# Patient Record
Sex: Female | Born: 1955 | Race: White | Hispanic: No | Marital: Married | Smoking: Never smoker
Health system: Southern US, Community
[De-identification: ages and names within clinical notes are randomized; demographics above are authoritative.]

## PROBLEM LIST (undated history)

## (undated) DIAGNOSIS — T7840XA Allergy, unspecified, initial encounter: Secondary | ICD-10-CM

## (undated) DIAGNOSIS — M1990 Unspecified osteoarthritis, unspecified site: Secondary | ICD-10-CM

## (undated) DIAGNOSIS — E785 Hyperlipidemia, unspecified: Secondary | ICD-10-CM

## (undated) DIAGNOSIS — R519 Headache, unspecified: Secondary | ICD-10-CM

## (undated) DIAGNOSIS — R51 Headache: Secondary | ICD-10-CM

## (undated) HISTORY — DX: Headache, unspecified: R51.9

## (undated) HISTORY — PX: TUBAL LIGATION: SHX77

## (undated) HISTORY — DX: Hyperlipidemia, unspecified: E78.5

## (undated) HISTORY — PX: TOOTH EXTRACTION: SUR596

## (undated) HISTORY — PX: BUNIONECTOMY: SHX129

## (undated) HISTORY — DX: Unspecified osteoarthritis, unspecified site: M19.90

## (undated) HISTORY — DX: Allergy, unspecified, initial encounter: T78.40XA

## (undated) HISTORY — DX: Headache: R51

## (undated) HISTORY — PX: ABLATION: SHX5711

---

## 1997-10-02 ENCOUNTER — Other Ambulatory Visit: Admission: RE | Admit: 1997-10-02 | Discharge: 1997-10-02 | Payer: Self-pay | Admitting: Obstetrics and Gynecology

## 1997-10-24 ENCOUNTER — Ambulatory Visit (HOSPITAL_COMMUNITY): Admission: RE | Admit: 1997-10-24 | Discharge: 1997-10-24 | Payer: Self-pay | Admitting: Obstetrics and Gynecology

## 1999-05-04 ENCOUNTER — Other Ambulatory Visit: Admission: RE | Admit: 1999-05-04 | Discharge: 1999-05-04 | Payer: Self-pay | Admitting: Obstetrics and Gynecology

## 1999-06-16 ENCOUNTER — Ambulatory Visit (HOSPITAL_COMMUNITY): Admission: RE | Admit: 1999-06-16 | Discharge: 1999-06-16 | Payer: Self-pay | Admitting: Obstetrics and Gynecology

## 1999-06-16 ENCOUNTER — Encounter (INDEPENDENT_AMBULATORY_CARE_PROVIDER_SITE_OTHER): Payer: Self-pay

## 2000-07-11 ENCOUNTER — Other Ambulatory Visit: Admission: RE | Admit: 2000-07-11 | Discharge: 2000-07-11 | Payer: Self-pay | Admitting: Obstetrics and Gynecology

## 2001-08-22 ENCOUNTER — Other Ambulatory Visit: Admission: RE | Admit: 2001-08-22 | Discharge: 2001-08-22 | Payer: Self-pay | Admitting: Obstetrics and Gynecology

## 2002-09-30 ENCOUNTER — Other Ambulatory Visit: Admission: RE | Admit: 2002-09-30 | Discharge: 2002-09-30 | Payer: Self-pay | Admitting: Obstetrics and Gynecology

## 2004-02-05 ENCOUNTER — Ambulatory Visit: Payer: Self-pay | Admitting: Family Medicine

## 2004-09-15 ENCOUNTER — Ambulatory Visit: Payer: Self-pay | Admitting: Family Medicine

## 2004-11-16 ENCOUNTER — Ambulatory Visit: Payer: Self-pay | Admitting: General Practice

## 2005-02-12 ENCOUNTER — Ambulatory Visit: Payer: Self-pay | Admitting: Family Medicine

## 2005-04-14 ENCOUNTER — Encounter (INDEPENDENT_AMBULATORY_CARE_PROVIDER_SITE_OTHER): Payer: Self-pay | Admitting: Internal Medicine

## 2005-04-20 ENCOUNTER — Ambulatory Visit: Payer: Self-pay | Admitting: Family Medicine

## 2005-04-20 ENCOUNTER — Other Ambulatory Visit: Admission: RE | Admit: 2005-04-20 | Discharge: 2005-04-20 | Payer: Self-pay | Admitting: Family Medicine

## 2005-06-20 ENCOUNTER — Ambulatory Visit: Payer: Self-pay | Admitting: Family Medicine

## 2005-06-20 ENCOUNTER — Encounter: Admission: RE | Admit: 2005-06-20 | Discharge: 2005-06-20 | Payer: Self-pay | Admitting: Family Medicine

## 2005-11-30 ENCOUNTER — Ambulatory Visit: Payer: Self-pay | Admitting: General Practice

## 2005-12-06 ENCOUNTER — Ambulatory Visit: Payer: Self-pay | Admitting: General Practice

## 2006-06-15 ENCOUNTER — Ambulatory Visit: Payer: Self-pay | Admitting: General Practice

## 2006-12-05 ENCOUNTER — Ambulatory Visit: Payer: Self-pay | Admitting: General Practice

## 2007-03-29 ENCOUNTER — Ambulatory Visit: Payer: Self-pay | Admitting: Family Medicine

## 2007-04-11 ENCOUNTER — Encounter (INDEPENDENT_AMBULATORY_CARE_PROVIDER_SITE_OTHER): Payer: Self-pay | Admitting: Internal Medicine

## 2007-06-18 ENCOUNTER — Ambulatory Visit: Payer: Self-pay | Admitting: Family Medicine

## 2007-06-22 ENCOUNTER — Encounter (INDEPENDENT_AMBULATORY_CARE_PROVIDER_SITE_OTHER): Payer: Self-pay | Admitting: Internal Medicine

## 2007-06-22 ENCOUNTER — Ambulatory Visit: Payer: Self-pay | Admitting: Family Medicine

## 2007-06-22 ENCOUNTER — Other Ambulatory Visit: Admission: RE | Admit: 2007-06-22 | Discharge: 2007-06-22 | Payer: Self-pay | Admitting: Family Medicine

## 2007-06-22 DIAGNOSIS — N6019 Diffuse cystic mastopathy of unspecified breast: Secondary | ICD-10-CM

## 2007-06-22 DIAGNOSIS — D259 Leiomyoma of uterus, unspecified: Secondary | ICD-10-CM

## 2007-06-22 LAB — CONVERTED CEMR LAB: Pap Smear: NORMAL

## 2007-07-02 ENCOUNTER — Encounter (INDEPENDENT_AMBULATORY_CARE_PROVIDER_SITE_OTHER): Payer: Self-pay | Admitting: *Deleted

## 2007-07-02 ENCOUNTER — Encounter (INDEPENDENT_AMBULATORY_CARE_PROVIDER_SITE_OTHER): Payer: Self-pay | Admitting: Internal Medicine

## 2007-08-21 ENCOUNTER — Telehealth (INDEPENDENT_AMBULATORY_CARE_PROVIDER_SITE_OTHER): Payer: Self-pay | Admitting: Internal Medicine

## 2007-09-07 ENCOUNTER — Ambulatory Visit: Payer: Self-pay | Admitting: Family Medicine

## 2007-09-10 ENCOUNTER — Encounter (INDEPENDENT_AMBULATORY_CARE_PROVIDER_SITE_OTHER): Payer: Self-pay | Admitting: *Deleted

## 2007-09-10 LAB — CONVERTED CEMR LAB: OCCULT 1: POSITIVE

## 2007-12-06 ENCOUNTER — Encounter (INDEPENDENT_AMBULATORY_CARE_PROVIDER_SITE_OTHER): Payer: Self-pay | Admitting: Internal Medicine

## 2007-12-12 ENCOUNTER — Ambulatory Visit: Payer: Self-pay | Admitting: General Practice

## 2007-12-19 ENCOUNTER — Ambulatory Visit: Payer: Self-pay | Admitting: Family Medicine

## 2007-12-19 DIAGNOSIS — E785 Hyperlipidemia, unspecified: Secondary | ICD-10-CM

## 2007-12-19 DIAGNOSIS — R7309 Other abnormal glucose: Secondary | ICD-10-CM

## 2008-02-01 ENCOUNTER — Encounter (INDEPENDENT_AMBULATORY_CARE_PROVIDER_SITE_OTHER): Payer: Self-pay | Admitting: Internal Medicine

## 2008-06-19 ENCOUNTER — Telehealth (INDEPENDENT_AMBULATORY_CARE_PROVIDER_SITE_OTHER): Payer: Self-pay | Admitting: Internal Medicine

## 2008-06-19 ENCOUNTER — Emergency Department (HOSPITAL_COMMUNITY): Admission: EM | Admit: 2008-06-19 | Discharge: 2008-06-19 | Payer: Self-pay | Admitting: Emergency Medicine

## 2008-06-23 ENCOUNTER — Telehealth (INDEPENDENT_AMBULATORY_CARE_PROVIDER_SITE_OTHER): Payer: Self-pay | Admitting: *Deleted

## 2008-06-24 ENCOUNTER — Encounter: Payer: Self-pay | Admitting: Cardiology

## 2008-06-24 ENCOUNTER — Ambulatory Visit: Payer: Self-pay

## 2008-06-24 DIAGNOSIS — R072 Precordial pain: Secondary | ICD-10-CM

## 2008-06-27 ENCOUNTER — Ambulatory Visit: Payer: Self-pay | Admitting: Cardiology

## 2008-06-27 ENCOUNTER — Ambulatory Visit: Payer: Self-pay | Admitting: Internal Medicine

## 2008-06-27 DIAGNOSIS — E782 Mixed hyperlipidemia: Secondary | ICD-10-CM

## 2008-06-27 DIAGNOSIS — F341 Dysthymic disorder: Secondary | ICD-10-CM | POA: Insufficient documentation

## 2008-07-29 ENCOUNTER — Ambulatory Visit: Payer: Self-pay | Admitting: Family Medicine

## 2008-09-04 ENCOUNTER — Encounter (INDEPENDENT_AMBULATORY_CARE_PROVIDER_SITE_OTHER): Payer: Self-pay | Admitting: Internal Medicine

## 2008-12-17 ENCOUNTER — Encounter (INDEPENDENT_AMBULATORY_CARE_PROVIDER_SITE_OTHER): Payer: Self-pay | Admitting: Internal Medicine

## 2008-12-17 ENCOUNTER — Ambulatory Visit: Payer: Self-pay | Admitting: General Practice

## 2008-12-18 ENCOUNTER — Encounter (INDEPENDENT_AMBULATORY_CARE_PROVIDER_SITE_OTHER): Payer: Self-pay | Admitting: Internal Medicine

## 2008-12-18 DIAGNOSIS — F410 Panic disorder [episodic paroxysmal anxiety] without agoraphobia: Secondary | ICD-10-CM

## 2008-12-18 DIAGNOSIS — M21619 Bunion of unspecified foot: Secondary | ICD-10-CM

## 2008-12-19 ENCOUNTER — Encounter (INDEPENDENT_AMBULATORY_CARE_PROVIDER_SITE_OTHER): Payer: Self-pay | Admitting: Internal Medicine

## 2008-12-19 ENCOUNTER — Ambulatory Visit: Payer: Self-pay | Admitting: Family Medicine

## 2008-12-19 ENCOUNTER — Other Ambulatory Visit: Admission: RE | Admit: 2008-12-19 | Discharge: 2008-12-19 | Payer: Self-pay | Admitting: Family Medicine

## 2008-12-19 DIAGNOSIS — E559 Vitamin D deficiency, unspecified: Secondary | ICD-10-CM

## 2008-12-19 LAB — CONVERTED CEMR LAB: Pap Smear: NORMAL

## 2008-12-25 ENCOUNTER — Encounter (INDEPENDENT_AMBULATORY_CARE_PROVIDER_SITE_OTHER): Payer: Self-pay | Admitting: Internal Medicine

## 2008-12-25 ENCOUNTER — Encounter: Payer: Self-pay | Admitting: Family Medicine

## 2008-12-31 ENCOUNTER — Telehealth (INDEPENDENT_AMBULATORY_CARE_PROVIDER_SITE_OTHER): Payer: Self-pay | Admitting: Internal Medicine

## 2009-01-01 ENCOUNTER — Encounter (INDEPENDENT_AMBULATORY_CARE_PROVIDER_SITE_OTHER): Payer: Self-pay | Admitting: Internal Medicine

## 2009-01-01 ENCOUNTER — Ambulatory Visit: Payer: Self-pay | Admitting: General Practice

## 2009-01-19 ENCOUNTER — Ambulatory Visit: Payer: Self-pay | Admitting: Family Medicine

## 2009-01-19 ENCOUNTER — Telehealth: Payer: Self-pay | Admitting: Family Medicine

## 2009-01-19 DIAGNOSIS — J40 Bronchitis, not specified as acute or chronic: Secondary | ICD-10-CM

## 2009-07-02 ENCOUNTER — Ambulatory Visit: Payer: Self-pay | Admitting: General Practice

## 2009-09-21 ENCOUNTER — Encounter (INDEPENDENT_AMBULATORY_CARE_PROVIDER_SITE_OTHER): Payer: Self-pay | Admitting: *Deleted

## 2009-12-09 ENCOUNTER — Ambulatory Visit: Payer: Self-pay | Admitting: General Practice

## 2010-03-18 NOTE — Letter (Signed)
Summary: Nadara Eaton letter  [facility]  [address] [place], [place] [address]   Phone: [phone]  Fax: [phone]       09/21/2009 MRN: [id number]  Maryama Granier [address] [place], [place]  [address]  Dear Ms. [facility] [place], and [place] announce the retirement of Arta Silence, M.D., from full-time practice at the [facility] office effective August 13, 2009 and his plans of returning part-time.  It is important to Dr. Hetty Ely and to our practice that you understand that [facility] - [facility] has seven physicians in our office for your health care needs.  We will continue to offer the same exceptional care that you have today.    Dr. Hetty Ely has spoken to many of you about his plans for retirement and returning part-time in the fall.   We will continue to work with you through the transition to schedule appointments for you in the office and meet the high standards that [place] is committed to.   Again, it is with great pleasure that we share the news that Dr. Hetty Ely will return to [facility] in October of 2011 with a reduced schedule.    If you have any questions, or would like to request an appointment with one of our physicians, please call us at [phone] and press the option for Scheduling an appointment.  We take pleasure in providing you with excellent patient care and look forward to seeing you at your next office visit.  Our [facility] Physicians are:  Tillman Abide, M.D. Laurita Quint, M.D. Roxy Manns, M.D. Kerby Nora, M.D. Hannah Beat, M.D. Ruthe Mannan, M.D. We proudly welcomed Raechel Ache, M.D. and Eustaquio Boyden, M.D. to the practice in July/August 2011.  Sincerely,   [facility]

## 2010-05-25 LAB — D-DIMER, QUANTITATIVE: D-Dimer, Quant: <0.22 ug/mL-FEU (ref 0.00–0.48)

## 2010-05-25 LAB — CBC
Hemoglobin: 13.9 g/dL (ref 12.0–15.0)
MCV: 84.1 fL (ref 78.0–100.0)
RBC: 4.7 MIL/uL (ref 3.87–5.11)

## 2010-05-25 LAB — POCT I-STAT, CHEM 8
BUN: 11 mg/dL (ref 6–23)
Chloride: 106 mEq/L (ref 96–112)
Creatinine, Ser: 0.8 mg/dL (ref 0.4–1.2)
Glucose, Bld: 111 mg/dL — ABNORMAL HIGH (ref 70–99)
Hemoglobin: 14.6 g/dL (ref 12.0–15.0)
Potassium: 3.4 mEq/L — ABNORMAL LOW (ref 3.5–5.1)
Sodium: 141 mEq/L (ref 135–145)
TCO2: 26 mmol/L (ref 0–100)

## 2010-05-25 LAB — DIFFERENTIAL
Basophils Absolute: 0 10*3/uL (ref 0.0–0.1)
Basophils Relative: 1 % (ref 0–1)
Eosinophils Relative: 2 % (ref 0–5)

## 2010-05-25 LAB — POCT CARDIAC MARKERS
CKMB, poc: 1 ng/mL (ref 1.0–8.0)
CKMB, poc: <1 ng/mL — ABNORMAL LOW (ref 1.0–8.0)
Myoglobin, poc: 44.8 ng/mL (ref 12–200)
Troponin i, poc: <0.05 ng/mL (ref 0.00–0.09)
Troponin i, poc: <0.05 ng/mL (ref 0.00–0.09)

## 2010-06-29 NOTE — Assessment & Plan Note (Signed)
[facility]                                 ON-CALL NOTE   NAME:Sparks, Valerie M                        MRN:          [id number]  DATE:06/20/2008                            DOB:          07-01-1955    [name] was in the emergency room at [facility] on the evening of  Thursday, Jun 19, 2008.  She was seen by Dr. Estell Harpin.  She had been under  a great deal of stress.  She came in with some chest discomfort and was  anxious.  She was assessed and there was no evidence of myocardial  ischemia.  Her EKG showed no change and her enzymes were normal.  Dr.  Estell Harpin contacted [place] about whether the patient could go home with early  outpatient followup.  In addition, the patient herself demanded to go  home at that point insisting that anxiety was the problem and that she  wanted an outpatient followup workup.   The patient is to be contacted with plans for early outpatient followup  in our office.  Name, [name].  Work phone number, [phone]  extension [phone].  Cell phone [phone] (the patient cannot be reached  on her cell phone during the work hours on Friday and she should be  called at work).   The patient does see Everrett Coombe, nurse practitioner, at our [facility].  We will make the arrangements for this early outpatient.  Followup with a stress study and an office followup.  This can be done  by me or anyone else in the group.     Luis Abed, MD, [facility]  Electronically Signed    JDK/MedQ  DD: 06/20/2008  DT: 06/20/2008  Job #: [id number]

## 2010-07-02 NOTE — Op Note (Signed)
[facility]  Patient:    Valerie Sparks, Valerie Sparks                    MRN: [id number] Proc. Date: 06/16/99 Adm. Date:  [id number] Attending:  Morene Antu                           Operative Report  PREOPERATIVE DIAGNOSIS:       Menorrhagia, fibroid uterus.  POSTOPERATIVE DIAGNOSIS:      Menorrhagia, fibroid uterus.  Submucosal fibroids.  OPERATION:                    D&C, hysteroscopy with resectoscope.  SURGEON:                      Sherry A. Rosalio Macadamia, M.D.  ASSISTANT:  ANESTHESIA:                   MAC.  ESTIMATED BLOOD LOSS:  INDICATIONS:                  This is a 55 year old, gravida 1, para 1-0-0-1, woman who has a menstrual period every month lasting four days.  The patient has had spotting for one to two weeks prior to her periods.  The patient complains of excessively heavy flow, changing a Super Plus tampon every 30 to 60 minutes and a pad.  She passes large clots and complains of dysmenorrhea.  Ultrasound was performed in the office which showed multiple fibroids including some small hyperechoic masses in the endometrial cavity.  Because of this, the patient is brought to the operating room for [facility], hysteroscopy with resectoscope.  FINDINGS:                     Eight weeks size anteflexed uterus.  No adnexal mass. Enlarged endometrial cavity with probable small submucosal fibroids.  DESCRIPTION OF PROCEDURE:     The patient is brought into the operating room and given adequate IV sedation.  She was placed in the dorsal lithotomy position. er perineum was washed with Betadine.  Pelvic examination was performed.  The bladder was in-and-out catheterized.  The surgeons gown and gloves were changed.  The patient was draped in a sterile fashion.  The speculum was placed within the vagina.  The vagina was washed with Betadine.  Paracervical block was administered with 1% Nesocaine.  Anterior lip of the cervix was grasped with a  single tooth tenaculum.  The cervix was sounded.  The cervix was dilated with Pratt dilators to a #31.  The hysteroscope was easily introduced into the endometrial cavity. Pictures were obtained.  Using a double loupe right angle resector, the polypoid tissue that was felt to be submucosal fibroids was excised circumferentially. Samples were taken throughout the uterus.  Bleeders were cauterized.  Once adequate hemostasis was present and samples had been taken throughout, all instruments were removed from the vagina.  The patient was taken out of the dorsal lithotomy position.  She was awakened and she was removed from the operating table to a stretcher in stable condition.  Complications were none.  Estimated blood loss as less than 5 cc.  Sorbitol differential 30 cc. DD:  06/16/99 TD:  06/17/99 Job: [id number]

## 2010-12-08 ENCOUNTER — Ambulatory Visit: Payer: Self-pay

## 2011-12-07 ENCOUNTER — Ambulatory Visit: Payer: Self-pay

## 2012-12-12 ENCOUNTER — Ambulatory Visit: Payer: Self-pay | Admitting: General Practice

## 2013-03-14 LAB — CBC AND DIFFERENTIAL
HCT: 42 % (ref 36–46)
HEMOGLOBIN: 13.7 g/dL (ref 12.0–16.0)
PLATELETS: 325 10*3/uL (ref 150–399)
WBC: 5.4 10^3/mL

## 2013-03-14 LAB — LIPID PANEL
CHOLESTEROL: 183 mg/dL (ref 0–200)
HDL: 41 mg/dL (ref 35–70)
LDL CALC: 116 mg/dL
TRIGLYCERIDES: 128 mg/dL (ref 40–160)

## 2013-03-14 LAB — HEPATIC FUNCTION PANEL
ALT: 28 U/L (ref 7–35)
AST: 27 U/L (ref 13–35)

## 2013-03-14 LAB — HEMOGLOBIN A1C: HEMOGLOBIN A1C: 6.1 % — AB (ref 4.0–6.0)

## 2013-03-14 LAB — TSH: TSH: 1.6 u[IU]/mL (ref 0.41–5.90)

## 2013-03-14 LAB — BASIC METABOLIC PANEL: Glucose: 102 mg/dL

## 2013-03-25 ENCOUNTER — Encounter: Payer: Self-pay | Admitting: Family Medicine

## 2013-03-25 ENCOUNTER — Encounter: Payer: Self-pay | Admitting: Internal Medicine

## 2013-03-25 ENCOUNTER — Ambulatory Visit (INDEPENDENT_AMBULATORY_CARE_PROVIDER_SITE_OTHER): Payer: PRIVATE HEALTH INSURANCE | Admitting: Family Medicine

## 2013-03-25 VITALS — BP 126/70 | HR 74 | Temp 98.1°F | Ht 62.0 in | Wt 173.5 lb

## 2013-03-25 DIAGNOSIS — R7309 Other abnormal glucose: Secondary | ICD-10-CM

## 2013-03-25 DIAGNOSIS — Z1211 Encounter for screening for malignant neoplasm of colon: Secondary | ICD-10-CM

## 2013-03-25 DIAGNOSIS — E559 Vitamin D deficiency, unspecified: Secondary | ICD-10-CM

## 2013-03-25 DIAGNOSIS — Z0000 Encounter for general adult medical examination without abnormal findings: Secondary | ICD-10-CM

## 2013-03-25 DIAGNOSIS — F341 Dysthymic disorder: Secondary | ICD-10-CM

## 2013-03-25 DIAGNOSIS — E782 Mixed hyperlipidemia: Secondary | ICD-10-CM

## 2013-03-25 NOTE — Patient Instructions (Addendum)
Great to see you. Please stop by to see Rosaria Ferries on your way out to set up your referral.  Keep exercising!

## 2013-03-25 NOTE — Assessment & Plan Note (Signed)
Stable off rx

## 2013-03-25 NOTE — Progress Notes (Signed)
Pre-visit discussion using our clinic review tool. No additional management support is needed unless otherwise documented below in the visit note.  

## 2013-03-25 NOTE — Assessment & Plan Note (Signed)
Stable on current rx. No changes. 

## 2013-03-25 NOTE — Assessment & Plan Note (Signed)
Reviewed preventive care protocols, scheduled due services, and updated immunizations Discussed nutrition, exercise, diet, and healthy lifestyle.  Refer to GI for screening colonoscopy. Orders Placed This Encounter  Procedures  . CBC and differential  . Basic metabolic panel  . Lipid panel  . Hepatic function panel  . Hemoglobin A1c  . TSH  . Ambulatory referral to Gastroenterology

## 2013-03-25 NOTE — Progress Notes (Signed)
Subjective:   Patient ID: Valerie Sparks, female    DOB: 1955/09/01, 58 y.o.   MRN: [id number]  Valerie Sparks is a pleasant 58 y.o. year old female with h/o anxiety/depression, HLD, hyperglycemia, vit D deficiency, who presents to clinic today with Establish Care and Annual Exam  on 03/25/2013  HPI: HLD- on Simvastatin 20 mg daily.  Denies any myalgias. Lab Results  Component Value Date   CHOL 183 03/14/2013   HDL 41 03/14/2013   LDLCALC 116 03/14/2013   TRIG 128 03/14/2013   Has GYN- no h/o abnormal pap smears. S/p ablation.  No h/o postmenopausal bleeding. Last pap smear in 06/2012. Mammogram in 11/2012.  Never had a colonoscopy. No FH of colon CA.  Denies any changes in her bowel habits or blood in her stool.  H/o hyperglycemia- Lab Results  Component Value Date   HGBA1C 6.1* 03/14/2013   Has been working on diet.  Just joined a gym with her husband.  Anxiety- was previously ativan.  Feels she no longer needs anything.  Denies any symptoms of anxiety or depression. Patient Active Problem List   Diagnosis Date Noted  . Routine general medical examination at a health care facility 03/25/2013  . VITAMIN D DEFICIENCY 12/19/2008  . PANIC ATTACK 12/18/2008  . BUNION, RIGHT FOOT 12/18/2008  . Mixed hyperlipidemia 06/27/2008  . ANXIETY DEPRESSION 06/27/2008  . HYPERLIPIDEMIA 12/19/2007  . HYPERGLYCEMIA 12/19/2007  . FIBROIDS, UTERUS 06/22/2007  . BREAST CYSTS, LEFT 06/22/2007   Past Medical History  Diagnosis Date  . Frequent headaches   . Allergy   . Hyperlipidemia    Past Surgical History  Procedure Laterality Date  . Bunionectomy    . Tubal ligation    . Ablation    . Tooth extraction     History  Substance Use Topics  . Smoking status: Never Smoker   . Smokeless tobacco: Never Used  . Alcohol Use: Yes   Family History  Problem Relation Age of Onset  . Hypertension Mother   . Diabetes Mother   . Heart disease Father   . Diabetes Sister   . Diabetes  Brother   . Cancer Paternal Aunt   . Cancer Maternal Grandmother   . Kidney disease Paternal Grandfather    Allergies  Allergen Reactions  . Pseudoephedrine Other (See Comments)    "takes me out of my skin"   No current outpatient prescriptions on file prior to visit.   No current facility-administered medications on file prior to visit.   The PMH, PSH, Social History, Family History, Medications, and allergies have been reviewed in [facility], and have been updated if relevant.   Review of Systems See HPI Patient reports no  vision/ hearing changes,anorexia, weight change, fever ,adenopathy, persistant / recurrent hoarseness, swallowing issues, chest pain, edema,persistant / recurrent cough, hemoptysis, dyspnea(rest, exertional, paroxysmal nocturnal), gastrointestinal  bleeding (melena, rectal bleeding), abdominal pain, excessive heart burn, GU symptoms(dysuria, hematuria, pyuria, voiding/incontinence  Issues) syncope, focal weakness, severe memory loss, concerning skin lesions, depression, anxiety, abnormal bruising/bleeding, major joint swelling, breast masses or abnormal vaginal bleeding.       Objective:    BP 126/70  Pulse 74  Temp(Src) 98.1 F (36.7 C) (Oral)  Ht 5\' 2"  (1.575 m)  Wt 173 lb 8 oz (78.699 kg)  BMI 31.73 kg/m2  SpO2 97%   Physical Exam   General:  Well-developed,well-nourished,in no acute distress; alert,appropriate and cooperative throughout examination Head:  normocephalic and atraumatic.   Eyes:  vision  grossly intact, pupils equal, pupils round, and pupils reactive to light.   Ears:  R ear normal and L ear normal.   Nose:  no external deformity.   Lungs:  Normal respiratory effort, chest expands symmetrically. Lungs are clear to auscultation, no crackles or wheezes. Heart:  Normal rate and regular rhythm. S1 and S2 normal without gallop, murmur, click, rub or other extra sounds. Abdomen:  Bowel sounds positive,abdomen soft and non-tender without masses,  organomegaly or hernias noted. Msk:  No deformity or scoliosis noted of thoracic or lumbar spine.   Extremities:  No clubbing, cyanosis, edema, or deformity noted with normal full range of motion of all joints.   Neurologic:  alert & oriented X3 and gait normal.   Skin:  Intact without suspicious lesions or rashes Cervical Nodes:  No lymphadenopathy noted Axillary Nodes:  No palpable lymphadenopathy Psych:  Cognition and judgment appear intact. Alert and cooperative with normal attention span and concentration. No apparent delusions, illusions, hallucinations       Assessment & Plan:   VITAMIN D DEFICIENCY  Mixed hyperlipidemia  HYPERGLYCEMIA  Routine general medical examination at a health care facility  ANXIETY DEPRESSION No Follow-up on file.

## 2013-04-05 ENCOUNTER — Ambulatory Visit (AMBULATORY_SURGERY_CENTER): Payer: PRIVATE HEALTH INSURANCE | Admitting: *Deleted

## 2013-04-05 VITALS — Ht 62.0 in | Wt 169.0 lb

## 2013-04-05 DIAGNOSIS — Z1211 Encounter for screening for malignant neoplasm of colon: Secondary | ICD-10-CM

## 2013-04-05 MED ORDER — NA SULFATE-K SULFATE-MG SULF 17.5-3.13-1.6 GM/177ML PO SOLN: ORAL | Status: DC

## 2013-04-05 NOTE — Progress Notes (Signed)
No egg or soy allergy 

## 2013-04-26 ENCOUNTER — Encounter: Payer: Self-pay | Admitting: Internal Medicine

## 2013-04-26 ENCOUNTER — Ambulatory Visit (AMBULATORY_SURGERY_CENTER): Payer: PRIVATE HEALTH INSURANCE | Admitting: Internal Medicine

## 2013-04-26 VITALS — BP 126/72 | HR 67 | Temp 98.0°F | Resp 19 | Ht 62.0 in | Wt 169.0 lb

## 2013-04-26 DIAGNOSIS — Z1211 Encounter for screening for malignant neoplasm of colon: Secondary | ICD-10-CM

## 2013-04-26 HISTORY — PX: COLONOSCOPY: SHX174

## 2013-04-26 MED ORDER — SODIUM CHLORIDE 0.9 % IV SOLN: 500.0000 mL | INTRAVENOUS | Status: DC

## 2013-04-26 NOTE — Progress Notes (Signed)
  [facility] Post-op Note  Patient: Valerie Sparks  Procedure(s) Performed: colonoscopy  Patient Location: [facility] - Recovery Area  Anesthesia Type: Deep Sedation/Propofol  Level of Consciousness: awake, oriented and patient cooperative  Airway and Oxygen Therapy: Patient Spontanous Breathing  Post-op Pain: none  Post-op Assessment:  Post-op Vital signs reviewed, Patient's Cardiovascular Status Stable, Respiratory Function Stable, Patent Airway, No signs of Nausea or vomiting and Pain level controlled  Post-op Vital Signs: Reviewed and stable  Complications: No apparent anesthesia complications  Craven Crean E 10:13 AM

## 2013-04-26 NOTE — Op Note (Signed)
[place]  [address] [place], [address]   COLONOSCOPY PROCEDURE REPORT  PATIENT: Valerie Sparks, Valerie Sparks  MR#: [id number] BIRTHDATE: March 09, 1955 , 58  yrs. old GENDER: Female ENDOSCOPIST: Gatha Mayer, MD, [facility] REFERRED YM:EBRAX Aron, M.D. PROCEDURE DATE:  04/26/2013 PROCEDURE:   Colonoscopy, screening First Screening Colonoscopy - Avg.  risk and is 50 yrs.  old or older Yes.  Prior Negative Screening - Now for repeat screening. N/A  History of Adenoma - Now for follow-up colonoscopy & has been > or = to 3 yrs.  N/A  Polyps Removed Today? No.  Recommend repeat exam, <10 yrs? No. ASA CLASS:   Class II INDICATIONS:average risk screening and first colonoscopy. MEDICATIONS: propofol (Diprivan) 200mg  IV, MAC sedation, administered by CRNA, and These medications were titrated to patient response per physician's verbal order  DESCRIPTION OF PROCEDURE:   After the risks benefits and alternatives of the procedure were thoroughly explained, informed consent was obtained.  A digital rectal exam revealed no rectal mass and A digital rectal exam revealed several skin tags.   The LB [id number]  endoscope was introduced through the anus and advanced to the terminal ileum which was intubated for a short distance. No adverse events experienced.   The quality of the prep was excellent using Suprep  The instrument was then slowly withdrawn as the colon was fully examined.  COLON FINDINGS: A normal appearing cecum, ileocecal valve, and appendiceal orifice were identified.  The ascending, hepatic flexure, transverse, splenic flexure, descending, sigmoid colon and rectum appeared unremarkable.  No polyps or cancers were seen.   A right colon retroflexion was performed.  Retroflexed views revealed anal tags. The time to cecum=3 minutes 28 seconds.  Withdrawal time=8 minutes 08 seconds.  The scope was withdrawn and the procedure completed. COMPLICATIONS: There were no  complications.  ENDOSCOPIC IMPRESSION: Normal colon  - excellent prep - first screening Anal skin tags  RECOMMENDATIONS: Repeat Colonscopy in 10 years 2025  eSigned:  Gatha Mayer, MD, [facility] 04/26/2013 10:13 AM   cc: Arnette Norris MD and The Patient

## 2013-04-26 NOTE — Patient Instructions (Addendum)
No polyps or cancer seen!  Next routine colonoscopy in 10 years - 2025  I appreciate the opportunity to care for you. Gatha Mayer, MD, FACG  YOU HAD AN ENDOSCOPIC PROCEDURE TODAY AT [facility]: Refer to the procedure report that was given to you for any specific questions about what was found during the examination.  If the procedure report does not answer your questions, please call your gastroenterologist to clarify.  If you requested that your care partner not be given the details of your procedure findings, then the procedure report has been included in a sealed envelope for you to review at your convenience later.  YOU SHOULD EXPECT: Some feelings of bloating in the abdomen. Passage of more gas than usual.  Walking can help get rid of the air that was put into your GI tract during the procedure and reduce the bloating. If you had a lower endoscopy (such as a colonoscopy or flexible sigmoidoscopy) you may notice spotting of blood in your stool or on the toilet paper. If you underwent a bowel prep for your procedure, then you may not have a normal bowel movement for a few days.  DIET: Your first meal following the procedure should be a light meal and then it is ok to progress to your normal diet.  A half-sandwich or bowl of soup is an example of a good first meal.  Heavy or fried foods are harder to digest and may make you feel nauseous or bloated.  Likewise meals heavy in dairy and vegetables can cause extra gas to form and this can also increase the bloating.  Drink plenty of fluids but you should avoid alcoholic beverages for 24 hours.  ACTIVITY: Your care partner should take you home directly after the procedure.  You should plan to take it easy, moving slowly for the rest of the day.  You can resume normal activity the day after the procedure however you should NOT DRIVE or use heavy machinery for 24 hours (because of the sedation medicines used during the test).     SYMPTOMS TO REPORT IMMEDIATELY: A gastroenterologist can be reached at any hour.  During normal business hours, 8:30 AM to 5:00 PM Monday through Friday, call [phone].  After hours and on weekends, please call the GI answering service at [phone] who will take a message and have the physician on call contact you.   Following lower endoscopy (colonoscopy or flexible sigmoidoscopy):  Excessive amounts of blood in the stool  Significant tenderness or worsening of abdominal pains  Swelling of the abdomen that is new, acute  Fever of 100F or higher  FOLLOW UP: If any biopsies were taken you will be contacted by phone or by letter within the next 1-3 weeks.  Call your gastroenterologist if you have not heard about the biopsies in 3 weeks.  Our staff will call the home number listed on your records the next business day following your procedure to check on you and address any questions or concerns that you may have at that time regarding the information given to you following your procedure. This is a courtesy call and so if there is no answer at the home number and we have not heard from you through the emergency physician on call, we will assume that you have returned to your regular daily activities without incident.  SIGNATURES/CONFIDENTIALITY: You and/or your care partner have signed paperwork which will be entered into your electronic medical record.  These  signatures attest to the fact that that the information above on your After Visit Summary has been reviewed and is understood.  Full responsibility of the confidentiality of this discharge information lies with you and/or your care-partner. 

## 2013-04-29 ENCOUNTER — Telehealth: Payer: Self-pay | Admitting: *Deleted

## 2013-04-29 NOTE — Telephone Encounter (Signed)
  Follow up Call-  Call back number 04/26/2013  Post procedure Call Back phone  # [phone]  Permission to leave phone message Yes     Patient questions:  Do you have a fever, pain , or abdominal swelling? no Pain Score  0 *  Have you tolerated food without any problems? yes  Have you been able to return to your normal activities? yes  Do you have any questions about your discharge instructions: Diet   no Medications  no Follow up visit  no  Do you have questions or concerns about your Care? no  Actions: * If pain score is 4 or above: No action needed, pain <4.

## 2013-12-10 ENCOUNTER — Ambulatory Visit: Payer: Self-pay | Admitting: General Practice

## 2014-06-09 ENCOUNTER — Encounter: Payer: Self-pay | Admitting: Podiatry

## 2014-06-09 ENCOUNTER — Ambulatory Visit (INDEPENDENT_AMBULATORY_CARE_PROVIDER_SITE_OTHER): Payer: PRIVATE HEALTH INSURANCE

## 2014-06-09 ENCOUNTER — Ambulatory Visit (INDEPENDENT_AMBULATORY_CARE_PROVIDER_SITE_OTHER): Payer: PRIVATE HEALTH INSURANCE | Admitting: Podiatry

## 2014-06-09 DIAGNOSIS — M79671 Pain in right foot: Secondary | ICD-10-CM

## 2014-06-09 DIAGNOSIS — M722 Plantar fascial fibromatosis: Secondary | ICD-10-CM | POA: Diagnosis not present

## 2014-06-09 MED ORDER — TRIAMCINOLONE ACETONIDE 10 MG/ML IJ SUSP
10.0000 mg | Freq: Once | INTRAMUSCULAR | Status: AC
  Administered 2014-06-09: 10 mg

## 2014-06-09 NOTE — Progress Notes (Signed)
   Subjective:    Patient ID: Valerie Sparks, female    DOB: 08-Apr-1955, 59 y.o.   MRN: [id number]  HPI  I have a lump in the arch of my right foot it aches, burns and sharp shooting pains. When it swells it can get very red. It is not too swollen today but very tender     Review of Systems  Musculoskeletal: Positive for back pain and joint swelling.       Objective:   Physical Exam        Assessment & Plan:

## 2014-06-10 NOTE — Progress Notes (Signed)
Subjective:     Patient ID: Valerie Sparks, female   DOB: 1955/08/07, 59 y.o.   MRN: [id number]  HPI patient presents stating I have this lump in my right arch that becomes painful and makes it hard for me to walk. It's only been there for a little while and I have not noted whether or not it was there prior to starting to hurt 3 weeks ago   Review of Systems  All other systems reviewed and are negative.      Objective:   Physical Exam  Constitutional: She is oriented to person, place, and time.  Cardiovascular: Intact distal pulses.   Musculoskeletal: Normal range of motion.  Neurological: She is oriented to person, place, and time.  Skin: Skin is warm.  Nursing note and vitals reviewed.  her vascular status intact with muscle strength adequate and range of motion subtalar midtarsal joint within normal limits. Patient is noted to have a mass within the right arch measuring approximately 8 mm x 8 mm that's painful when pressed and relatively new in its origin. Patient is found to have good digital perfusion and is well oriented 3     Assessment:     Possible plantar fibroma versus small cyst in the plantar aspect of the right arch    Plan:     H&P and condition reviewed with patient. We're going to do a careful injection to try to shrink it and reduce pain and if it were to reoccur or become larger or painful or change color than it we'll need to be removed. Patient understands this and risk and wants procedure and I carefully injected with 3 mg Kenalog 5 mg Xylocaine and advised on heat and cold treatment area

## 2014-06-30 ENCOUNTER — Ambulatory Visit (INDEPENDENT_AMBULATORY_CARE_PROVIDER_SITE_OTHER): Payer: PRIVATE HEALTH INSURANCE | Admitting: Podiatry

## 2014-06-30 DIAGNOSIS — M722 Plantar fascial fibromatosis: Secondary | ICD-10-CM | POA: Diagnosis not present

## 2014-07-01 NOTE — Progress Notes (Signed)
Subjective:     Patient ID: Valerie Sparks, female   DOB: 08/04/1955, 59 y.o.   MRN: [id number]  HPI patient presents stating the swelling has reduced but I still have discomfort in my right arch   Review of Systems     Objective:   Physical Exam Neurovascular status intact with muscle strength within normal limits and noted to have continued discomfort in the mid arch area right but no palpable nodules at the current time    Assessment:     Appears to be more inflammatory in nature along the fascial band with fibromas or cyst which have reduced in size currently    Plan:     I want to try to continue to treat this conservatively and I did dispense a night splint to try to stretch the tendon and advised on heat stretch and ice of the area. Patient will be seen back to recheck again in the next 4 weeks and may require different types treatment depending on response

## 2014-07-28 ENCOUNTER — Ambulatory Visit (INDEPENDENT_AMBULATORY_CARE_PROVIDER_SITE_OTHER): Payer: PRIVATE HEALTH INSURANCE | Admitting: Podiatry

## 2014-07-28 ENCOUNTER — Encounter: Payer: Self-pay | Admitting: Podiatry

## 2014-07-28 VITALS — BP 170/74 | HR 68 | Resp 13

## 2014-07-28 DIAGNOSIS — M722 Plantar fascial fibromatosis: Secondary | ICD-10-CM | POA: Diagnosis not present

## 2014-07-28 MED ORDER — TRIAMCINOLONE ACETONIDE 10 MG/ML IJ SUSP
10.0000 mg | Freq: Once | INTRAMUSCULAR | Status: AC
  Administered 2014-07-28: 10 mg

## 2014-07-29 NOTE — Progress Notes (Signed)
Subjective:     Patient ID: Valerie Sparks, female   DOB: Oct 09, 1955, 59 y.o.   MRN: [id number]  HPI patient states the arch is quite a bit better but it is still sore and I feel like possibly the inflammation is returning   Review of Systems     Objective:   Physical Exam Neurovascular status intact with some reoccurrence of inflammatory changes with small nodular formation in the mid arch region right with pain upon palpation    Assessment:     Plantar fasciitis with possibility for plantar nodule right mid arch area    Plan:     I advised on physical therapy consisting of heat stretches ice and night splint usage and I did go ahead and I reinjected the plantar fascia mid arch 3 mg Kenalog 5 mg Xylocaine Marcaine. I gave instructions and should recur or become painful work and the need to consider surgical excision

## 2014-09-24 ENCOUNTER — Ambulatory Visit (INDEPENDENT_AMBULATORY_CARE_PROVIDER_SITE_OTHER): Payer: PRIVATE HEALTH INSURANCE | Admitting: Podiatry

## 2014-09-24 ENCOUNTER — Encounter: Payer: Self-pay | Admitting: Podiatry

## 2014-09-24 VITALS — BP 109/67 | HR 76 | Resp 16

## 2014-09-24 DIAGNOSIS — M722 Plantar fascial fibromatosis: Secondary | ICD-10-CM

## 2014-09-24 NOTE — Patient Instructions (Signed)
Pre-Operative Instructions  Congratulations, you have decided to take an important step to improving your quality of life.  You can be assured that the doctors of [facility] will be with you every step of the way.  1. Plan to be at the surgery center/hospital at least 1 (one) hour prior to your scheduled time unless otherwise directed by the surgical center/hospital staff.  You must have a responsible adult accompany you, remain during the surgery and drive you home.  Make sure you have directions to the surgical center/hospital and know how to get there on time. 2. For hospital based surgery you will need to obtain a history and physical form from your family physician within 1 month prior to the date of surgery- we will give you a form for you primary physician.  3. We make every effort to accommodate the date you request for surgery.  There are however, times where surgery dates or times have to be moved.  We will contact you as soon as possible if a change in schedule is required.   4. No Aspirin/Ibuprofen for one week before surgery.  If you are on aspirin, any non-steroidal anti-inflammatory medications (Mobic, Aleve, Ibuprofen) you should stop taking it 7 days prior to your surgery.  You make take Tylenol  For pain prior to surgery.  5. Medications- If you are taking daily heart and blood pressure medications, seizure, reflux, allergy, asthma, anxiety, pain or diabetes medications, make sure the surgery center/hospital is aware before the day of surgery so they may notify you which medications to take or avoid the day of surgery. 6. No food or drink after midnight the night before surgery unless directed otherwise by surgical center/hospital staff. 7. No alcoholic beverages 24 hours prior to surgery.  No smoking 24 hours prior to or 24 hours after surgery. 8. Wear loose pants or shorts- loose enough to fit over bandages, boots, and casts. 9. No slip on shoes, sneakers are best. 10. Bring  your boot with you to the surgery center/hospital.  Also bring crutches or a walker if your physician has prescribed it for you.  If you do not have this equipment, it will be provided for you after surgery. 11. If you have not been contracted by the surgery center/hospital by the day before your surgery, call to confirm the date and time of your surgery. 12. Leave-time from work may vary depending on the type of surgery you have.  Appropriate arrangements should be made prior to surgery with your employer. 13. Prescriptions will be provided immediately following surgery by your doctor.  Have these filled as soon as possible after surgery and take the medication as directed. 14. Remove nail polish on the operative foot. 15. Wash the night before surgery.  The night before surgery wash the foot and leg well with the antibacterial soap provided and water paying special attention to beneath the toenails and in between the toes.  Rinse thoroughly with water and dry well with a towel.  Perform this wash unless told not to do so by your physician.  Enclosed: 1 Ice pack (please put in freezer the night before surgery)   1 Hibiclens skin cleaner   Pre-op Instructions  If you have any questions regarding the instructions, do not hesitate to call our office.  [place]: [address] [place], [place] [address] [phone]  [place]: [address] [place], [place] [address] [phone]  [place]: [address]  [place], [place] [address] [phone]  Dr. Richard   Tuchman DPM, Dr. Norman Regal DPM Dr. Richard Sikora DPM, Dr. M. Todd Hyatt DPM, Dr. Kathryn Egerton DPM 

## 2014-09-24 NOTE — Progress Notes (Signed)
Subjective:     Patient ID: Valerie Sparks, female   DOB: 10/13/1955, 59 y.o.   MRN: [id number]  HPI patient presents stating these nodules are killing me on my right foot and something has to be done as I cannot walk comfortably. States that they have grown in size and there seems to be a second one   Review of Systems     Objective:   Physical Exam Neurovascular status intact muscle strength was adequate with a nodule measuring approximately 1 x 1 cm plantar aspect right arch and a smaller distal one measuring 5 x 5 mm that is painful also. 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 I've recommended excision at this time due to long-standing nature growth in size pain and failure to respond to conservative care. Patient wants to have this done and at this time I allowed her to read a consent form reviewing alternative treatments complications associated with this procedure and the fact that recovery can take 6 months to one year and I do want her nonweightbearing for 2-4 weeks. Patient wants surgery and signs consent form and is dispensed air fracture walker at the current time for usage after surgery

## 2014-09-25 ENCOUNTER — Telehealth: Payer: Self-pay | Admitting: *Deleted

## 2014-09-25 NOTE — Telephone Encounter (Signed)
"  I want to reschedule my surgery.  I went back to work and that pain hit me.  I said I got to go ahead and take care of this."  When are you scheduled?  "I'm scheduled for 10/28/2014."  The only other time he has is 10/21/2014.  "Okay, I'm already off that Monday for Labor Day, that will be fine."  I'll call the surgical center and reschedule it.  "Thank you, hope I'm not putting you through too much trouble."  You are not at all.  I called surgical center and rescheduled the surgery from 10/28/2014 to 10/21/2014.

## 2014-10-02 LAB — LIPID PANEL
Cholesterol: 164 mg/dL (ref 0–200)
HDL: 45 mg/dL (ref 35–70)
LDL CALC: 94 mg/dL
TRIGLYCERIDES: 123 mg/dL (ref 40–160)

## 2014-10-02 LAB — HEMOGLOBIN A1C: Hemoglobin A1C: 6.1

## 2014-10-02 LAB — TSH: TSH: 1.67 u[IU]/mL (ref 0.41–5.90)

## 2014-10-15 ENCOUNTER — Telehealth: Payer: Self-pay | Admitting: *Deleted

## 2014-10-15 NOTE — Telephone Encounter (Signed)
I called to check benefits for patient's surgery scheduled for 10/21/2014.  Valerie Sparks informed me patient has a $1500 deductible.  $460 met thus far.  80% / 20% benefits, no pre-cert needed.  Reference number is [id number].

## 2014-10-21 ENCOUNTER — Encounter: Payer: Self-pay | Admitting: *Deleted

## 2014-10-21 DIAGNOSIS — M722 Plantar fascial fibromatosis: Secondary | ICD-10-CM

## 2014-10-21 DIAGNOSIS — D492 Neoplasm of unspecified behavior of bone, soft tissue, and skin: Secondary | ICD-10-CM | POA: Diagnosis not present

## 2014-10-22 ENCOUNTER — Telehealth: Payer: Self-pay | Admitting: *Deleted

## 2014-10-22 NOTE — Progress Notes (Signed)
Surgery performed at [facility], Plantar Fibroma right foot.  Prescriptions were written for Demerol 50 mg, take one or two by mouth every 4-6 hours as needed for pain, quantity 35, 0 refills. Phenergan 25 mg, one or two by mouth every 4-6 hours with Demerol, quantity 35, 0 refills.

## 2014-10-22 NOTE — Telephone Encounter (Signed)
"  I'm doing okay.  I haven't had any pain.  I been taking my pain medication every 4.5 hours.  I been keeping it elevated like he told me to."   Wonderful, glad to hear.  How was everything at the surgical center?  "It was awesome!  Everyone was great."  We'll see you on 10/27/2014 at 2:45pm.

## 2014-10-27 ENCOUNTER — Other Ambulatory Visit: Payer: Self-pay

## 2014-10-30 ENCOUNTER — Encounter: Payer: Self-pay | Admitting: Podiatry

## 2014-10-30 ENCOUNTER — Ambulatory Visit (INDEPENDENT_AMBULATORY_CARE_PROVIDER_SITE_OTHER): Payer: PRIVATE HEALTH INSURANCE | Admitting: Podiatry

## 2014-10-30 VITALS — BP 144/85 | HR 87 | Resp 16

## 2014-10-30 DIAGNOSIS — M722 Plantar fascial fibromatosis: Secondary | ICD-10-CM | POA: Diagnosis not present

## 2014-10-31 NOTE — Progress Notes (Signed)
Subjective:     Patient ID: Valerie Sparks, female   DOB: Mar 24, 1955, 59 y.o.   MRN: [id number]  HPI patient presents the bottom of my right arch is doing very well   Review of Systems     Objective:   Physical Exam Neurovascular status intact negative Homans sign noted with wound edges well coapted and stitches intact with minimal edema noted and no drainage    Assessment:     Doing well post plantar fibroma excision right    Plan:     Reapplied sterile dressing advised on continued boot usage and nonweightbearing. Reappoint in 2 weeks for stitch removal or earlier if any issues should occur

## 2014-11-14 ENCOUNTER — Ambulatory Visit (INDEPENDENT_AMBULATORY_CARE_PROVIDER_SITE_OTHER): Payer: PRIVATE HEALTH INSURANCE | Admitting: Podiatry

## 2014-11-14 VITALS — BP 132/78 | HR 74 | Resp 16

## 2014-11-14 DIAGNOSIS — M722 Plantar fascial fibromatosis: Secondary | ICD-10-CM

## 2014-11-14 DIAGNOSIS — Z9889 Other specified postprocedural states: Secondary | ICD-10-CM

## 2014-11-14 NOTE — Progress Notes (Signed)
Subjective:     Patient ID: Valerie Sparks, female   DOB: [birth year], 59 y.o.   MRN: [id number]  HPI patient states I'm doing great with my right foot the incision is healing well and I'm having minimal discomfort   Review of Systems     Objective:   Physical Exam Neurovascular status intact negative Homans sign noted with well-healed surgical site plantar aspect right arch    Assessment:     Doing well from plantar fibroma excision right    Plan:     Stitches removed wound edges well coapted and advised on continued compression and surgical shoe usage. Reappoint to recheck in 5 weeks

## 2014-12-01 ENCOUNTER — Encounter: Payer: Self-pay | Admitting: *Deleted

## 2014-12-01 ENCOUNTER — Telehealth: Payer: Self-pay | Admitting: *Deleted

## 2014-12-01 NOTE — Telephone Encounter (Addendum)
Pt states she needs to discuss going back to work, but is still having some complications with her surgery.  I still have 2 places with hard deep scabs and redness and pain, and lumps under the skin and is still painful wakes at night also.  Pt feels she would benefit in returning to work after her evaluation post op appt 12/19/2014. Left message for HR Barnetta Hammersmith to leave the fax number to her office on my Voicemail.  Work letter faxed to [phone].

## 2014-12-03 ENCOUNTER — Other Ambulatory Visit: Payer: Self-pay | Admitting: Obstetrics and Gynecology

## 2014-12-03 ENCOUNTER — Ambulatory Visit
Admission: RE | Admit: 2014-12-03 | Discharge: 2014-12-03 | Disposition: A | Discharge status: Home / Self Care | Payer: PRIVATE HEALTH INSURANCE | Source: Ambulatory Visit | Attending: Obstetrics and Gynecology | Admitting: Obstetrics and Gynecology

## 2014-12-03 DIAGNOSIS — Z1231 Encounter for screening mammogram for malignant neoplasm of breast: Secondary | ICD-10-CM | POA: Diagnosis present

## 2014-12-19 ENCOUNTER — Ambulatory Visit (INDEPENDENT_AMBULATORY_CARE_PROVIDER_SITE_OTHER): Payer: PRIVATE HEALTH INSURANCE | Admitting: Podiatry

## 2014-12-19 ENCOUNTER — Ambulatory Visit (INDEPENDENT_AMBULATORY_CARE_PROVIDER_SITE_OTHER): Payer: PRIVATE HEALTH INSURANCE

## 2014-12-19 ENCOUNTER — Encounter: Payer: Self-pay | Admitting: Podiatry

## 2014-12-19 VITALS — BP 150/79 | HR 86 | Temp 99.7°F | Resp 16

## 2014-12-19 DIAGNOSIS — Z9889 Other specified postprocedural states: Secondary | ICD-10-CM | POA: Diagnosis not present

## 2014-12-19 DIAGNOSIS — M722 Plantar fascial fibromatosis: Secondary | ICD-10-CM

## 2014-12-24 NOTE — Progress Notes (Signed)
Patient ID: Valerie Sparks, female   DOB: 03/28/1955, 59 y.o.   MRN: [id number]   Subjective: 59 year old female presents the office today status post right foot plantar fibroma excision. She states that she does continue to have some mild discomfort along the incision as well as a burning sensation in the bottom of her foot. She has some difficulty putting weight down to the bottom of her foot overlying the scar. She states that her swelling has improved and she has been wearing compression sock. She denies any redness around the incision or any increase in warmth. No recent injury or trauma. She believes that she may have been putting weight on the foot to soon after surgery which may have contributed to her increased pain. No other complaints at this time. She denies any systemic complaints as fevers, chills, nausea, vomiting. No calf pain, chest pain, shortness of breath.  Objective: AAO 3, NAD  DP/PT pulses palpable 2/4, CRT less than 3 seconds Protective sensation intact Simms Weinstein monofilament  Incision along the plantar medial aspect of the right foot as well coapted scars warmth. There is mild tenderness to palpation to surgical site and there is mild edema around the surgical site. There is no surrounding erythema, ascending cellulitis, fluctuance, crepitus, malodor, drainage/purulence. There is no palpable masses infected this time. There is no other areas of tenderness bilateral lower extremities. There is no other areas of edema, erythema, increase in warmth. There is no pain with calf compression, swelling, erythema.   Assessment:  59 year old female status post right foot plantar fibroma excision with mild continued pain  Plan: -Treatment options discussed including all alternatives, risks, and complications -I recommend Coco butter or Vitamin E cream over the incision daily to help the scarring. Can use a frozen water bottle to help ice the area as well.  -Offloading pads  dispensed -Continue supportive shoegear - For now she has 2 more weeks off of work. She still has some difficulty bearing full weight on her foot. I recommended her to hold off on the back to work for now. She will follow-up with Dr. Paulla Dolly in 2 weeks. In the meantime, call with any questions or concerns.   Celesta Gentile, DPM

## 2014-12-31 ENCOUNTER — Encounter: Payer: PRIVATE HEALTH INSURANCE | Admitting: Family Medicine

## 2015-01-01 ENCOUNTER — Encounter: Payer: Self-pay | Admitting: Podiatry

## 2015-01-01 ENCOUNTER — Ambulatory Visit (INDEPENDENT_AMBULATORY_CARE_PROVIDER_SITE_OTHER): Payer: PRIVATE HEALTH INSURANCE | Admitting: Podiatry

## 2015-01-01 DIAGNOSIS — M722 Plantar fascial fibromatosis: Secondary | ICD-10-CM

## 2015-01-01 DIAGNOSIS — Z9889 Other specified postprocedural states: Secondary | ICD-10-CM

## 2015-01-01 NOTE — Progress Notes (Signed)
Subjective:     Patient ID: Valerie Sparks, female   DOB: June 01, 1955, 59 y.o.   MRN: [id number]  HPI patient presents stating it's healing well but I feel like there could be a small knot on the side towards my heel   Review of Systems     Objective:   Physical Exam Neurovascular status is intact with well-healing plantar incision site right arch secondary to plantar fibroma excision with slight discomfort in the more proximal portion but very difficult to ascertain whether or not this is a new nodule    Assessment:     Most likely is just inflammatory with patient gradually getting better and is going to return on Monday to work and will be seen back by [place] in about 8 weeks and was given instructions on heat    Plan:     Reviewed condition and reappoint to recheck

## 2015-01-02 ENCOUNTER — Other Ambulatory Visit: Payer: PRIVATE HEALTH INSURANCE

## 2015-01-02 ENCOUNTER — Encounter: Payer: Self-pay | Admitting: Podiatry

## 2015-03-05 ENCOUNTER — Ambulatory Visit (INDEPENDENT_AMBULATORY_CARE_PROVIDER_SITE_OTHER): Payer: PRIVATE HEALTH INSURANCE | Admitting: Podiatry

## 2015-03-05 ENCOUNTER — Encounter: Payer: Self-pay | Admitting: Podiatry

## 2015-03-05 DIAGNOSIS — Z9889 Other specified postprocedural states: Secondary | ICD-10-CM

## 2015-03-05 DIAGNOSIS — M722 Plantar fascial fibromatosis: Secondary | ICD-10-CM

## 2015-03-05 MED ORDER — TRIAMCINOLONE ACETONIDE 10 MG/ML IJ SUSP
10.0000 mg | Freq: Once | INTRAMUSCULAR | Status: AC
  Administered 2015-03-05: 10 mg

## 2015-03-05 MED ORDER — TRAMADOL HCL 50 MG PO TABS: 50.0000 mg | ORAL_TABLET | Freq: Three times a day (TID) | ORAL | Status: DC

## 2015-03-06 ENCOUNTER — Telehealth: Payer: Self-pay | Admitting: *Deleted

## 2015-03-06 MED ORDER — NONFORMULARY OR COMPOUNDED ITEM: Status: DC

## 2015-03-06 NOTE — Telephone Encounter (Signed)
Dr. Paulla Dolly ordered Valerie Sparks antiinflammatory cream compound.  Faxed to [employer].

## 2015-03-06 NOTE — Progress Notes (Signed)
Subjective:     Patient ID: Forest Gleason, female   DOB: May 22, 1955, 60 y.o.   MRN: [id number]  HPI patient states I'm still having discomfort in the right plantar incision site with no discomfort within the center of it but more towards my heel   Review of Systems     Objective:   Physical Exam Neurovascular status intact muscle strength adequate with incision that is starting to soften in the plantar aspect of the right arch with discomfort in the more proximal portion with a small irritation and inflammation.    Assessment:     Inflammatory condition with possibility for small proximal nodule    Plan:     I went ahead and I did a proximal injection from the lateral side 3 mg Kenalog 5 mg Xylocaine and we will begin topical treatments that I ordered today to try to reduce inflammation and also patient will utilize tramadol as needed. Reappoint for [place] to recheck again in the next few weeks

## 2015-05-07 ENCOUNTER — Encounter: Payer: Self-pay | Admitting: Podiatry

## 2015-05-07 ENCOUNTER — Ambulatory Visit (INDEPENDENT_AMBULATORY_CARE_PROVIDER_SITE_OTHER): Payer: PRIVATE HEALTH INSURANCE | Admitting: Podiatry

## 2015-05-07 VITALS — BP 143/77 | HR 72 | Resp 16

## 2015-05-07 DIAGNOSIS — Z9889 Other specified postprocedural states: Secondary | ICD-10-CM

## 2015-05-07 DIAGNOSIS — M722 Plantar fascial fibromatosis: Secondary | ICD-10-CM

## 2015-05-08 NOTE — Progress Notes (Signed)
Subjective:     Patient ID: Valerie Sparks, female   DOB: 03-05-1955, 60 y.o.   MRN: [id number]  HPI patient states I been doing pretty well with the fibroma but I did develop some discomfort on the dorsal medial aspect of the foot that at times makes it hard to walk   Review of Systems     Objective:   Physical Exam Neurovascular status intact muscle strength adequate with discomfort in the dorsum of the right foot around anterior tibial tendon with no indication of muscle strength loss    Assessment:     Inflammatory tendinitis which may have been secondary to compensatory changes    Plan:     Careful sheath injection right 3 mg Kenalog 5 mill grams Xylocaine and advised on chances for rupture prior to doing the injection and at this time advised on reduced activity and dispensed fascial brace. Reappoint to recheck

## 2015-05-11 ENCOUNTER — Ambulatory Visit (INDEPENDENT_AMBULATORY_CARE_PROVIDER_SITE_OTHER): Payer: PRIVATE HEALTH INSURANCE | Admitting: Family Medicine

## 2015-05-11 ENCOUNTER — Encounter: Payer: Self-pay | Admitting: Family Medicine

## 2015-05-11 VITALS — BP 114/60 | HR 73 | Temp 98.1°F | Wt 183.5 lb

## 2015-05-11 DIAGNOSIS — E785 Hyperlipidemia, unspecified: Secondary | ICD-10-CM

## 2015-05-11 DIAGNOSIS — R7309 Other abnormal glucose: Secondary | ICD-10-CM

## 2015-05-11 MED ORDER — SIMVASTATIN 20 MG PO TABS: 20.0000 mg | ORAL_TABLET | Freq: Every day | ORAL | Status: DC

## 2015-05-11 NOTE — Assessment & Plan Note (Signed)
Stable. Given copy of eat right diet. Discussed exercise and weight loss.

## 2015-05-11 NOTE — Progress Notes (Signed)
Pre visit review using our clinic review tool, if applicable. No additional management support is needed unless otherwise documented below in the visit note. 

## 2015-05-11 NOTE — Progress Notes (Signed)
Subjective:   Patient ID: Forest Gleason, female    DOB: [birth year], 60 y.o.   MRN: [id number]  Kiyomi Matty is a pleasant 60 y.o. year old female with h/o anxiety/depression, HLD, hyperglycemia, vit D deficiency, who presents to clinic today with Follow-up  on 05/11/2015  HPI:  I have not seen her since she established care with me over two years ago.  HLD- on Simvastatin 20 mg daily.  Denies any myalgias.  She did have labs done at work in August and brings them in today. Lab Results  Component Value Date   CHOL 164 10/02/2014   HDL 45 10/02/2014   LDLCALC 94 10/02/2014   TRIG 123 10/02/2014    H/o hyperglycemia- Lab Results  Component Value Date   HGBA1C 6.1 10/02/2014   Has been working on diet.  Just had foot surgery and has recently started going back to the gym.  Anxiety- was previously ativan.  Feels she no longer needs anything.  Denies any symptoms of anxiety or depression. Patient Active Problem List   Diagnosis Date Noted  . VITAMIN D DEFICIENCY 12/19/2008  . PANIC ATTACK 12/18/2008  . BUNION, RIGHT FOOT 12/18/2008  . Mixed hyperlipidemia 06/27/2008  . ANXIETY DEPRESSION 06/27/2008  . HLD (hyperlipidemia) 12/19/2007  . HYPERGLYCEMIA 12/19/2007  . FIBROIDS, UTERUS 06/22/2007  . BREAST CYSTS, LEFT 06/22/2007   Past Medical History  Diagnosis Date  . Frequent headaches   . Allergy   . Hyperlipidemia    Past Surgical History  Procedure Laterality Date  . Bunionectomy      right  . Tubal ligation    . Ablation    . Tooth extraction     Social History  Substance Use Topics  . Smoking status: Never Smoker   . Smokeless tobacco: Never Used  . Alcohol Use: Yes     Comment: very rarely   Family History  Problem Relation Age of Onset  . Hypertension Mother   . Diabetes Mother   . Heart disease Father   . Diabetes Sister   . Diabetes Brother   . Breast cancer Paternal Aunt 62  . Cancer Maternal Grandmother   . Kidney disease Paternal  Grandfather   . Colon cancer Neg Hx   . Esophageal cancer Neg Hx   . Rectal cancer Neg Hx   . Stomach cancer Neg Hx    Allergies  Allergen Reactions  . Actifed Cold-Allergy [Chlorpheniramine-Phenyleph Er]   . Pseudoephedrine Other (See Comments)    "takes me out of my skin"   Current Outpatient Prescriptions on File Prior to Visit  Medication Sig Dispense Refill  . Multiple Vitamins-Minerals (MULTIVITAMIN PO) Take by mouth daily.    . NONFORMULARY OR COMPOUNDED [place] compound:  Antiinflammatory Cream - Diclofenac 3%, Baclofen 2%, Cyclobenzaprine 2%, Lidocaine 2%, dispense 120 grams, apply 1-2 grams to affected area 3-4 times daily, +6refills. 120 each 6   No current facility-administered medications on file prior to visit.   The PMH, PSH, Social History, Family History, Medications, and allergies have been reviewed in [facility], and have been updated if relevant.   Review of Systems  Constitutional: Negative.   HENT: Negative.   Eyes: Negative.   Respiratory: Negative.   Cardiovascular: Negative.   Gastrointestinal: Negative.   Endocrine: Negative.   Genitourinary: Negative.   Musculoskeletal: Negative.   Allergic/Immunologic: Negative.   Neurological: Negative.   Hematological: Negative.   Psychiatric/Behavioral: Negative.   All other systems reviewed and are negative.  Objective:    BP 114/60 mmHg  Pulse 73  Temp(Src) 98.1 F (36.7 C) (Oral)  Wt 183 lb 8 oz (83.235 kg)  SpO2 97%   Physical Exam   General:  Well-developed,well-nourished,in no acute distress; alert,appropriate and cooperative throughout examination Head:  normocephalic and atraumatic.   Eyes:  vision grossly intact, pupils equal, pupils round, and pupils reactive to light.   Ears:  R ear normal and L ear normal.   Nose:  no external deformity.   Lungs:  Normal respiratory effort, chest expands symmetrically. Lungs are clear to auscultation, no crackles or wheezes. Heart:   Normal rate and regular rhythm. S1 and S2 normal without gallop, murmur, click, rub or other extra sounds. Abdomen:  Bowel sounds positive,abdomen soft and non-tender without masses, organomegaly or hernias noted. Msk:  No deformity or scoliosis noted of thoracic or lumbar spine.   Extremities:  No clubbing, cyanosis, edema, or deformity noted with normal full range of motion of all joints.   Neurologic:  alert & oriented X3 and gait normal.   Skin:  Intact without suspicious lesions or rashes Cervical Nodes:  No lymphadenopathy noted Axillary Nodes:  No palpable lymphadenopathy Psych:  Cognition and judgment appear intact. Alert and cooperative with normal attention span and concentration. No apparent delusions, illusions, hallucinations       Assessment & Plan:   HLD (hyperlipidemia) No Follow-up on file.

## 2015-05-11 NOTE — Assessment & Plan Note (Signed)
Rx refilled.

## 2015-09-24 ENCOUNTER — Ambulatory Visit (INDEPENDENT_AMBULATORY_CARE_PROVIDER_SITE_OTHER): Payer: PRIVATE HEALTH INSURANCE | Admitting: Podiatry

## 2015-09-24 DIAGNOSIS — M722 Plantar fascial fibromatosis: Secondary | ICD-10-CM | POA: Diagnosis not present

## 2015-09-24 MED ORDER — TRIAMCINOLONE ACETONIDE 10 MG/ML IJ SUSP
10.0000 mg | Freq: Once | INTRAMUSCULAR | Status: AC
  Administered 2015-09-24: 10 mg

## 2015-09-25 ENCOUNTER — Ambulatory Visit: Payer: PRIVATE HEALTH INSURANCE | Admitting: Podiatry

## 2015-09-25 NOTE — Progress Notes (Signed)
Subjective:     Patient ID: Valerie Sparks, female   DOB: [birth year], 60 y.o.   MRN: [id number]  HPI patient presents stating that the incision itself is doing well but proximal to it there is a small inflammation and pain and it feels like she's walking on a lump   Review of Systems     Objective:   Physical Exam Neurovascular status intact with well-healed surgical site with no lumps within it but more proximal there is an area that is irritated    Assessment:     Nodular formation which may be beginning to plantar fibroma or cyst proximal fascia    Plan:     Since or catching in early I did inject this area 3 mg Kenalog 5 mill grams Xylocaine and instructed on physical therapy anti-inflammatories and heat and ice therapy and reappoint to recheck in about a month

## 2015-10-05 ENCOUNTER — Other Ambulatory Visit: Payer: Self-pay | Admitting: Family Medicine

## 2015-10-06 LAB — CMP12+LP+TP+TSH+6AC+CBC/D/PLT
ALBUMIN: 4.4 g/dL (ref 3.5–5.5)
ALK PHOS: 67 IU/L (ref 39–117)
ALT: 18 IU/L (ref 0–32)
AST: 24 IU/L (ref 0–40)
Albumin/Globulin Ratio: 1.8 (ref 1.2–2.2)
BASOS: 0 %
BILIRUBIN TOTAL: 0.2 mg/dL (ref 0.0–1.2)
BUN/Creatinine Ratio: 23 (ref 9–23)
BUN: 16 mg/dL (ref 6–24)
Basophils Absolute: 0 10*3/uL (ref 0.0–0.2)
CHOL/HDL RATIO: 3.8 ratio (ref 0.0–4.4)
CHOLESTEROL TOTAL: 195 mg/dL (ref 100–199)
Calcium: 9.3 mg/dL (ref 8.7–10.2)
Chloride: 100 mmol/L (ref 96–106)
Creatinine, Ser: 0.71 mg/dL (ref 0.57–1.00)
EOS (ABSOLUTE): 0.1 10*3/uL (ref 0.0–0.4)
EOS: 2 %
Estimated CHD Risk: 0.7 times avg. (ref 0.0–1.0)
FREE THYROXINE INDEX: 1.8 (ref 1.2–4.9)
GFR calc non Af Amer: 94 mL/min/{1.73_m2} (ref >59)
GFR, EST AFRICAN AMERICAN: 108 mL/min/{1.73_m2} (ref >59)
GGT: 11 IU/L (ref 0–60)
GLOBULIN, TOTAL: 2.5 g/dL (ref 1.5–4.5)
Glucose: 89 mg/dL (ref 65–99)
HDL: 52 mg/dL (ref >39)
HEMOGLOBIN: 14 g/dL (ref 11.1–15.9)
Hematocrit: 42.8 % (ref 34.0–46.6)
IMMATURE GRANS (ABS): 0 10*3/uL (ref 0.0–0.1)
IMMATURE GRANULOCYTES: 0 %
Iron: 60 ug/dL (ref 27–159)
LDH: 239 IU/L — AB (ref 119–226)
LDL CALC: 119 mg/dL — AB (ref 0–99)
LYMPHS: 31 %
Lymphocytes Absolute: 1.7 10*3/uL (ref 0.7–3.1)
MCH: 27.7 pg (ref 26.6–33.0)
MCHC: 32.7 g/dL (ref 31.5–35.7)
MCV: 85 fL (ref 79–97)
MONOCYTES: 9 %
MONOS ABS: 0.5 10*3/uL (ref 0.1–0.9)
NEUTROS PCT: 58 %
Neutrophils Absolute: 3.1 10*3/uL (ref 1.4–7.0)
Phosphorus: 3.3 mg/dL (ref 2.5–4.5)
Platelets: 289 10*3/uL (ref 150–379)
Potassium: 4.2 mmol/L (ref 3.5–5.2)
RBC: 5.05 x10E6/uL (ref 3.77–5.28)
RDW: 14 % (ref 12.3–15.4)
SODIUM: 139 mmol/L (ref 134–144)
T3 Uptake Ratio: 26 % (ref 24–39)
T4, Total: 6.8 ug/dL (ref 4.5–12.0)
TRIGLYCERIDES: 119 mg/dL (ref 0–149)
TSH: 2.39 u[IU]/mL (ref 0.450–4.500)
Total Protein: 6.9 g/dL (ref 6.0–8.5)
Uric Acid: 4.2 mg/dL (ref 2.5–7.1)
VLDL CHOLESTEROL CAL: 24 mg/dL (ref 5–40)
WBC: 5.3 10*3/uL (ref 3.4–10.8)

## 2015-10-06 LAB — HGB A1C W/O EAG: Hgb A1c MFr Bld: 5.8 % — ABNORMAL HIGH (ref 4.8–5.6)

## 2015-11-20 ENCOUNTER — Other Ambulatory Visit: Payer: Self-pay | Admitting: Family Medicine

## 2015-11-20 DIAGNOSIS — Z1231 Encounter for screening mammogram for malignant neoplasm of breast: Secondary | ICD-10-CM

## 2015-12-07 ENCOUNTER — Ambulatory Visit
Admission: RE | Admit: 2015-12-07 | Discharge: 2015-12-07 | Disposition: A | Discharge status: Home / Self Care | Payer: PRIVATE HEALTH INSURANCE | Source: Ambulatory Visit | Attending: Family Medicine | Admitting: Family Medicine

## 2015-12-07 DIAGNOSIS — Z1231 Encounter for screening mammogram for malignant neoplasm of breast: Secondary | ICD-10-CM

## 2016-04-14 ENCOUNTER — Other Ambulatory Visit: Payer: Self-pay | Admitting: *Deleted

## 2016-04-14 DIAGNOSIS — E785 Hyperlipidemia, unspecified: Secondary | ICD-10-CM

## 2016-04-14 DIAGNOSIS — E559 Vitamin D deficiency, unspecified: Secondary | ICD-10-CM

## 2016-04-14 DIAGNOSIS — R7309 Other abnormal glucose: Secondary | ICD-10-CM

## 2016-04-14 NOTE — Progress Notes (Signed)
Labs drawn as 6 month f/u per [facility] NP.

## 2016-04-15 LAB — LIPID PANEL
CHOL/HDL RATIO: 4.3 ratio (ref 0.0–4.4)
Cholesterol, Total: 205 mg/dL — ABNORMAL HIGH (ref 100–199)
HDL: 48 mg/dL (ref >39)
LDL Calculated: 127 mg/dL — ABNORMAL HIGH (ref 0–99)
Triglycerides: 150 mg/dL — ABNORMAL HIGH (ref 0–149)
VLDL Cholesterol Cal: 30 mg/dL (ref 5–40)

## 2016-04-15 LAB — HGB A1C W/O EAG: Hgb A1c MFr Bld: 5.8 % — ABNORMAL HIGH (ref 4.8–5.6)

## 2016-04-15 LAB — VITAMIN D 25 HYDROXY (VIT D DEFICIENCY, FRACTURES): Vit D, 25-Hydroxy: 28.2 ng/mL — ABNORMAL LOW (ref 30.0–100.0)

## 2016-04-29 ENCOUNTER — Ambulatory Visit: Payer: Self-pay | Admitting: *Deleted

## 2016-04-29 VITALS — BP 130/64 | HR 88 | Temp 97.5°F

## 2016-04-29 DIAGNOSIS — R1114 Bilious vomiting: Secondary | ICD-10-CM

## 2016-04-29 DIAGNOSIS — R1030 Lower abdominal pain, unspecified: Secondary | ICD-10-CM

## 2016-04-29 NOTE — Progress Notes (Signed)
Pt with generalized lower abd pain, nausea and vomiting x2 this am. Sudden onset. Nothing to eat this morning. Dizziness with bending over or quick position changes. Vitals WNL. Believes could be related to eating a soup yesterday at 12:30pm that could have been sitting out too long. Going home from work. Encouraged to push fluids, ice chips, popsicles, gatorade, water. Able to tolerate small, slow sips of ginger ale at this time. Unable to tolerare solids. Slowly advance diet as tolerated. Has dramamine and something else for nausea that she does not recall the name of at home. Will use that if needed. Instructed to call PCP or proceed to ER if unable to tolerate fluids and/or meds, no urination in >8hrs, or worsening pain. Pt verbalizes understanding and agreement.

## 2016-05-18 ENCOUNTER — Ambulatory Visit (INDEPENDENT_AMBULATORY_CARE_PROVIDER_SITE_OTHER): Payer: PRIVATE HEALTH INSURANCE | Admitting: Family Medicine

## 2016-05-18 VITALS — BP 138/82 | HR 68 | Ht 62.0 in | Wt 175.5 lb

## 2016-05-18 DIAGNOSIS — Z0000 Encounter for general adult medical examination without abnormal findings: Secondary | ICD-10-CM | POA: Diagnosis not present

## 2016-05-18 DIAGNOSIS — E785 Hyperlipidemia, unspecified: Secondary | ICD-10-CM

## 2016-05-18 DIAGNOSIS — E782 Mixed hyperlipidemia: Secondary | ICD-10-CM | POA: Diagnosis not present

## 2016-05-18 DIAGNOSIS — E559 Vitamin D deficiency, unspecified: Secondary | ICD-10-CM

## 2016-05-18 DIAGNOSIS — Z01419 Encounter for gynecological examination (general) (routine) without abnormal findings: Secondary | ICD-10-CM | POA: Insufficient documentation

## 2016-05-18 MED ORDER — SIMVASTATIN 20 MG PO TABS: 20.0000 mg | ORAL_TABLET | Freq: Every day | ORAL | 3 refills | Status: DC

## 2016-05-18 NOTE — Assessment & Plan Note (Signed)
Reviewed preventive care protocols, scheduled due services, and updated immunizations Discussed nutrition, exercise, diet, and healthy lifestyle.  

## 2016-05-18 NOTE — Progress Notes (Signed)
Subjective:   Patient ID: Valerie Sparks, female    DOB: 28-Oct-1955, 61 y.o.   MRN: [id number]  Valerie Sparks is a pleasant 61 y.o. year old female who presents to clinic today with Annual Exam (no pap/mammo)  on 05/18/2016  HPI:   HLD- on Simvastatin 20 mg daily.  Denies any myalgias.  Lab Results  Component Value Date   CHOL 205 (H) 04/14/2016   HDL 48 04/14/2016   LDLCALC 127 (H) 04/14/2016   TRIG 150 (H) 04/14/2016   CHOLHDL 4.3 04/14/2016   Lab Results  Component Value Date   ALT 18 10/05/2015   AST 24 10/05/2015   GGT 11 10/05/2015   ALKPHOS 67 10/05/2015   BILITOT 0.2 10/05/2015     Has GYN- no h/o abnormal pap smears. Pap smear 03/2016 S/p ablation.  No h/o postmenopausal bleeding.  Mammogram 12/07/15  Colonoscopy 04/26/13 Denies any changes in her bowel habits or blood in her stool.   Anxiety- was previously ativan.  Feels she no longer needs anything.  Denies any symptoms of anxiety or depression.  No current outpatient prescriptions on file prior to visit.   No current facility-administered medications on file prior to visit.     Allergies  Allergen Reactions  . Actifed Cold-Allergy [Chlorpheniramine-Phenyleph Er]   . Pseudoephedrine Other (See Comments)    "takes me out of my skin"    Past Medical History:  Diagnosis Date  . Allergy   . Frequent headaches   . Hyperlipidemia     Past Surgical History:  Procedure Laterality Date  . ABLATION    . BUNIONECTOMY     right  . TOOTH EXTRACTION    . TUBAL LIGATION      Family History  Problem Relation Age of Onset  . Hypertension Mother   . Diabetes Mother   . Heart disease Father   . Diabetes Sister   . Diabetes Brother   . Breast cancer Paternal Aunt 60  . Cancer Maternal Grandmother   . Kidney disease Paternal Grandfather   . Colon cancer Neg Hx   . Esophageal cancer Neg Hx   . Rectal cancer Neg Hx   . Stomach cancer Neg Hx     Social History   Social History  .  Marital status: Married    Spouse name: N/A  . Number of children: N/A  . Years of education: N/A   Occupational History  . Not on file.   Social History Main Topics  . Smoking status: Never Smoker  . Smokeless tobacco: Never Used  . Alcohol use Yes     Comment: very rarely  . Drug use: No  . Sexual activity: Yes   Other Topics Concern  . Not on file   Social History Narrative  . No narrative on file   The PMH, PSH, Social History, Family History, Medications, and allergies have been reviewed in [facility], and have been updated if relevant.  . Review of Systems  Constitutional: Negative.   HENT: Negative.   Eyes: Negative.   Respiratory: Negative.   Cardiovascular: Negative.   Gastrointestinal: Negative.   Endocrine: Negative.   Genitourinary: Negative.   Musculoskeletal: Negative.   Skin: Negative.   Allergic/Immunologic: Negative.   Neurological: Positive for headaches. Negative for dizziness, tremors, seizures, syncope, facial asymmetry, speech difficulty, weakness, light-headedness and numbness.  Hematological: Negative.   Psychiatric/Behavioral: Negative.   All other systems reviewed and are negative.      Objective:  BP 138/82   Pulse 68   Ht 5\' 2"  (1.575 m)   Wt 175 lb 8 oz (79.6 kg)   SpO2 98%   BMI 32.10 kg/m   Wt Readings from Last 3 Encounters:  05/18/16 175 lb 8 oz (79.6 kg)  05/11/15 183 lb 8 oz (83.2 kg)  04/26/13 169 lb (76.7 kg)    Physical Exam   General:  Well-developed,well-nourished,in no acute distress; alert,appropriate and cooperative throughout examination Head:  normocephalic and atraumatic.   Eyes:  vision grossly intact, PERRL Ears:  R ear normal and L ear normal externally, TMs clear bilaterally Nose:  no external deformity.   Mouth:  good dentition.   Neck:  No deformities, masses, or tenderness noted. Lungs:  Normal respiratory effort, chest expands symmetrically. Lungs are clear to auscultation, no crackles or  wheezes. Heart:  Normal rate and regular rhythm. S1 and S2 normal without gallop, murmur, click, rub or other extra sounds. Abdomen:  Bowel sounds positive,abdomen soft and non-tender without masses, organomegaly or hernias noted. Msk:  No deformity or scoliosis noted of thoracic or lumbar spine.   Extremities:  No clubbing, cyanosis, edema, or deformity noted with normal full range of motion of all joints.   Neurologic:  alert & oriented X3 and gait normal.   Skin:  Intact without suspicious lesions or rashes Cervical Nodes:  No lymphadenopathy noted Axillary Nodes:  No palpable lymphadenopathy Psych:  Cognition and judgment appear intact. Alert and cooperative with normal attention span and concentration. No apparent delusions, illusions, hallucinations        Assessment & Plan:   Hyperlipidemia, unspecified hyperlipidemia type  Well woman exam  Mixed hyperlipidemia  Vitamin D deficiency No Follow-up on file.

## 2016-05-18 NOTE — Assessment & Plan Note (Signed)
Controlled with current dose of statin. Rx refilled today.

## 2016-10-04 ENCOUNTER — Ambulatory Visit: Payer: Self-pay | Admitting: *Deleted

## 2016-10-04 VITALS — BP 137/78 | HR 62 | Ht 63.0 in | Wt 174.0 lb

## 2016-10-04 DIAGNOSIS — Z0000 Encounter for general adult medical examination without abnormal findings: Secondary | ICD-10-CM

## 2016-10-04 NOTE — Progress Notes (Signed)
[employer] insurance premium discount evaluation: Labs Drawn. Replacements ROI form signed. Tobacco Free Attestation form signed.  Forms placed in paper chart. Okay to route results to pcp per pt. 

## 2016-10-05 LAB — CMP12+LP+TP+TSH+6AC+CBC/D/PLT
A/G RATIO: 1.9 (ref 1.2–2.2)
ALT: 21 IU/L (ref 0–32)
AST: 28 IU/L (ref 0–40)
Albumin: 4.7 g/dL (ref 3.6–4.8)
Alkaline Phosphatase: 67 IU/L (ref 39–117)
BASOS ABS: 0.1 10*3/uL (ref 0.0–0.2)
BASOS: 1 %
BILIRUBIN TOTAL: 0.4 mg/dL (ref 0.0–1.2)
BUN / CREAT RATIO: 17 (ref 12–28)
BUN: 12 mg/dL (ref 8–27)
CHLORIDE: 99 mmol/L (ref 96–106)
CHOL/HDL RATIO: 3.5 ratio (ref 0.0–4.4)
CREATININE: 0.7 mg/dL (ref 0.57–1.00)
Calcium: 9.9 mg/dL (ref 8.7–10.3)
Cholesterol, Total: 184 mg/dL (ref 100–199)
EOS (ABSOLUTE): 0.2 10*3/uL (ref 0.0–0.4)
EOS: 3 %
ESTIMATED CHD RISK: 0.6 times avg. (ref 0.0–1.0)
Free Thyroxine Index: 1.8 (ref 1.2–4.9)
GFR, EST AFRICAN AMERICAN: 109 mL/min/{1.73_m2} (ref >59)
GFR, EST NON AFRICAN AMERICAN: 94 mL/min/{1.73_m2} (ref >59)
GGT: 10 IU/L (ref 0–60)
GLUCOSE: 97 mg/dL (ref 65–99)
Globulin, Total: 2.5 g/dL (ref 1.5–4.5)
HDL: 52 mg/dL (ref >39)
HEMATOCRIT: 42.2 % (ref 34.0–46.6)
HEMOGLOBIN: 14.2 g/dL (ref 11.1–15.9)
Immature Grans (Abs): 0 10*3/uL (ref 0.0–0.1)
Immature Granulocytes: 0 %
Iron: 121 ug/dL (ref 27–159)
LDH: 308 IU/L — AB (ref 119–226)
LDL CALC: 101 mg/dL — AB (ref 0–99)
Lymphocytes Absolute: 2 10*3/uL (ref 0.7–3.1)
Lymphs: 31 %
MCH: 28.2 pg (ref 26.6–33.0)
MCHC: 33.6 g/dL (ref 31.5–35.7)
MCV: 84 fL (ref 79–97)
MONOCYTES: 9 %
Monocytes Absolute: 0.6 10*3/uL (ref 0.1–0.9)
Neutrophils Absolute: 3.6 10*3/uL (ref 1.4–7.0)
Neutrophils: 56 %
PLATELETS: 306 10*3/uL (ref 150–379)
Phosphorus: 4 mg/dL (ref 2.5–4.5)
Potassium: 4.1 mmol/L (ref 3.5–5.2)
RBC: 5.03 x10E6/uL (ref 3.77–5.28)
RDW: 14.3 % (ref 12.3–15.4)
SODIUM: 140 mmol/L (ref 134–144)
T3 UPTAKE RATIO: 26 % (ref 24–39)
T4, Total: 6.8 ug/dL (ref 4.5–12.0)
TOTAL PROTEIN: 7.2 g/dL (ref 6.0–8.5)
TSH: 2.45 u[IU]/mL (ref 0.450–4.500)
Triglycerides: 157 mg/dL — ABNORMAL HIGH (ref 0–149)
URIC ACID: 4 mg/dL (ref 2.5–7.1)
VLDL CHOLESTEROL CAL: 31 mg/dL (ref 5–40)
WBC: 6.4 10*3/uL (ref 3.4–10.8)

## 2016-10-05 LAB — HGB A1C W/O EAG: HEMOGLOBIN A1C: 5.8 % — AB (ref 4.8–5.6)

## 2016-10-05 LAB — VITAMIN D 25 HYDROXY (VIT D DEFICIENCY, FRACTURES): Vit D, 25-Hydroxy: 32.3 ng/mL (ref 30.0–100.0)

## 2016-10-10 NOTE — Progress Notes (Signed)
Results reviewed with pt. LDH elevated, worse than previous. Lipids improved from previous but still elevated. A1c elevated, same as last year. Pt reports trying to eat more low carb for weight loss. Additional diet and exercise recommendations given for lipids, A1c and wt management. Copy of labs given to pt. Results routed to pcp per pt request. No further questions/concerns.

## 2016-12-09 ENCOUNTER — Other Ambulatory Visit: Payer: Self-pay | Admitting: Obstetrics and Gynecology

## 2016-12-09 ENCOUNTER — Other Ambulatory Visit: Payer: Self-pay | Admitting: Family Medicine

## 2016-12-09 DIAGNOSIS — Z1231 Encounter for screening mammogram for malignant neoplasm of breast: Secondary | ICD-10-CM

## 2016-12-15 ENCOUNTER — Ambulatory Visit
Admission: RE | Admit: 2016-12-15 | Discharge: 2016-12-15 | Disposition: A | Discharge status: Home / Self Care | Payer: PRIVATE HEALTH INSURANCE | Source: Ambulatory Visit | Attending: Obstetrics and Gynecology | Admitting: Obstetrics and Gynecology

## 2016-12-15 DIAGNOSIS — Z1231 Encounter for screening mammogram for malignant neoplasm of breast: Secondary | ICD-10-CM

## 2017-01-23 IMAGING — MG MM DIGITAL SCREENING BILAT W/ TOMO W/ CAD
8 of 12 series · 8 of 28 positions shown · non-contrast
Comparison: Previous exam(s).

CLINICAL DATA: Screening.

EXAM:
2D DIGITAL SCREENING BILATERAL MAMMOGRAM WITH CAD AND ADJUNCT TOMO

[L MLO synth-2D]
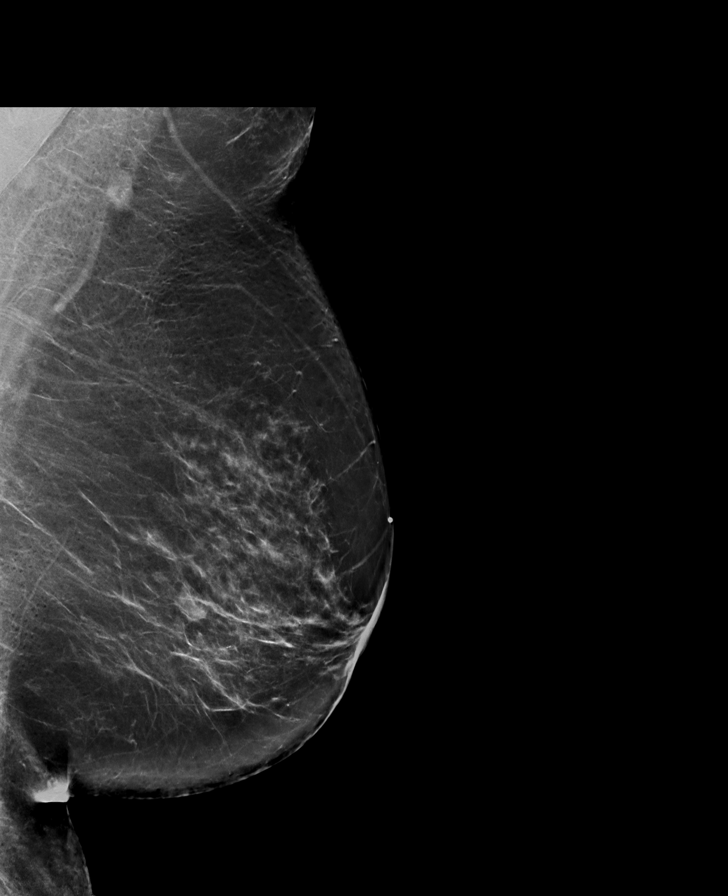

[L CC]
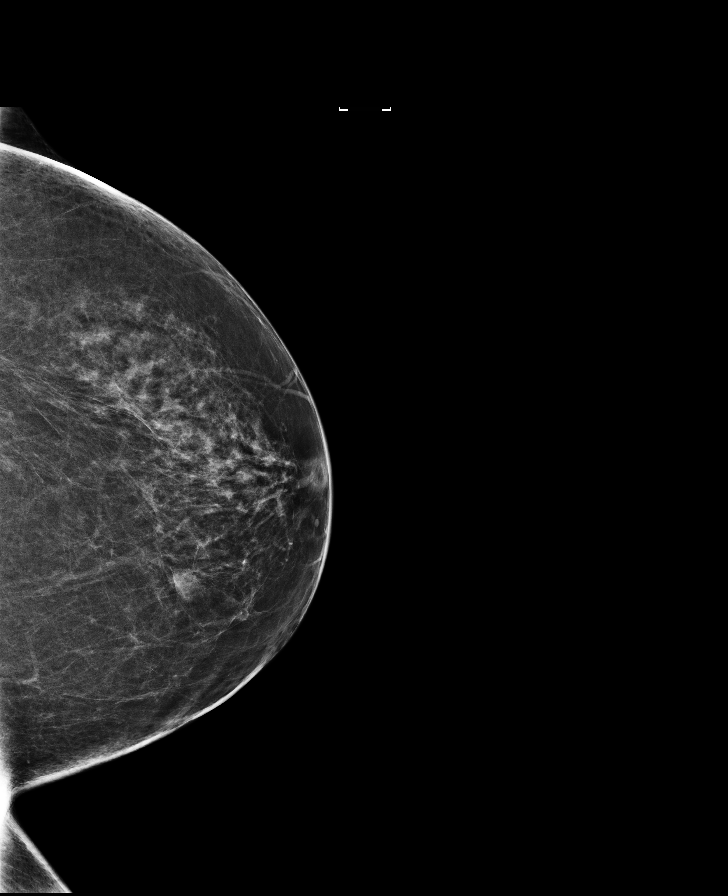

[R MLO synth-2D]
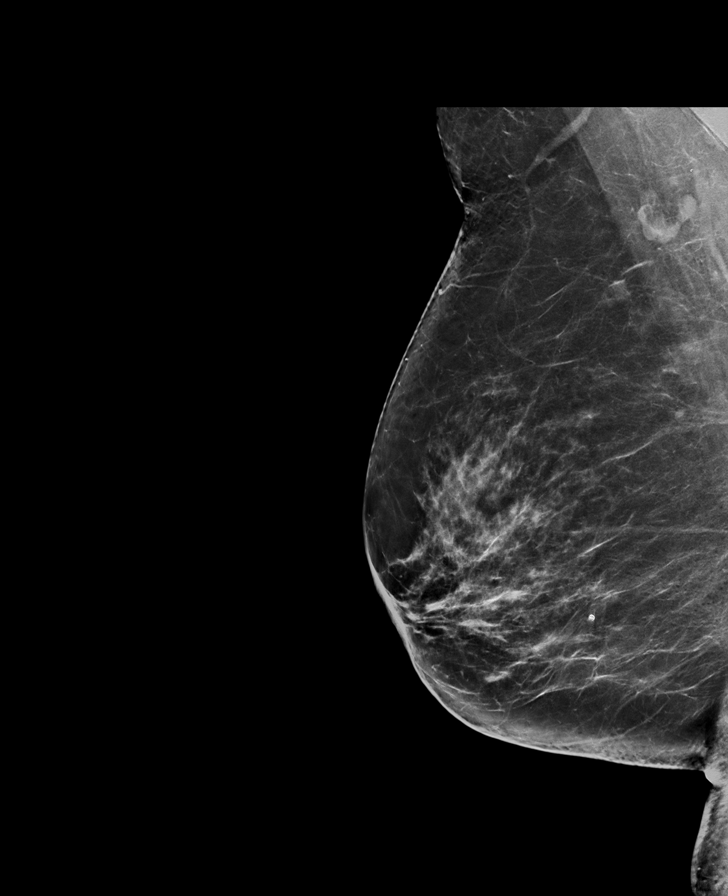

[R CC synth-2D]
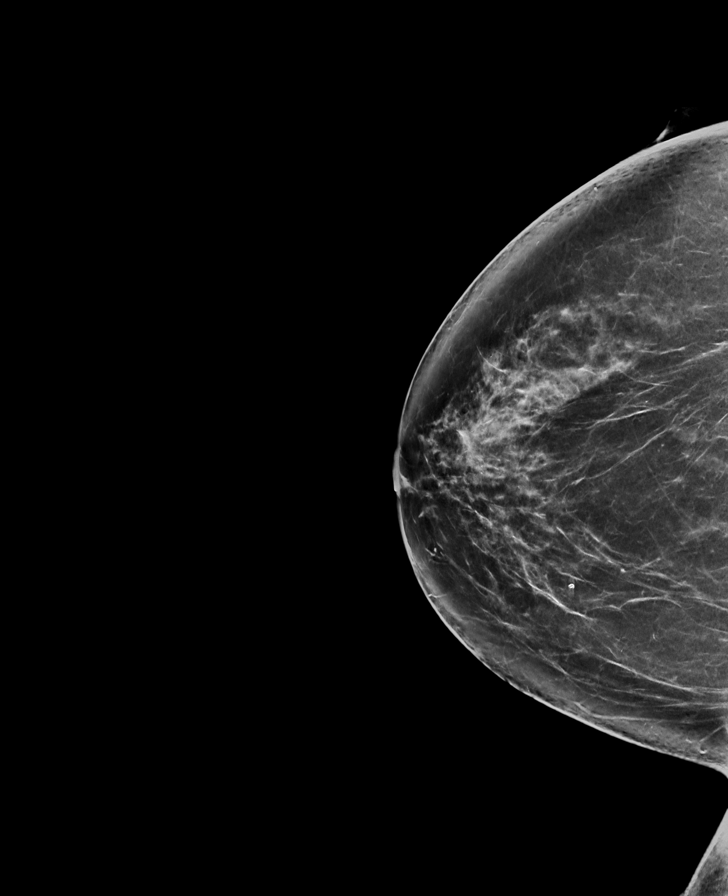

[R MLO]
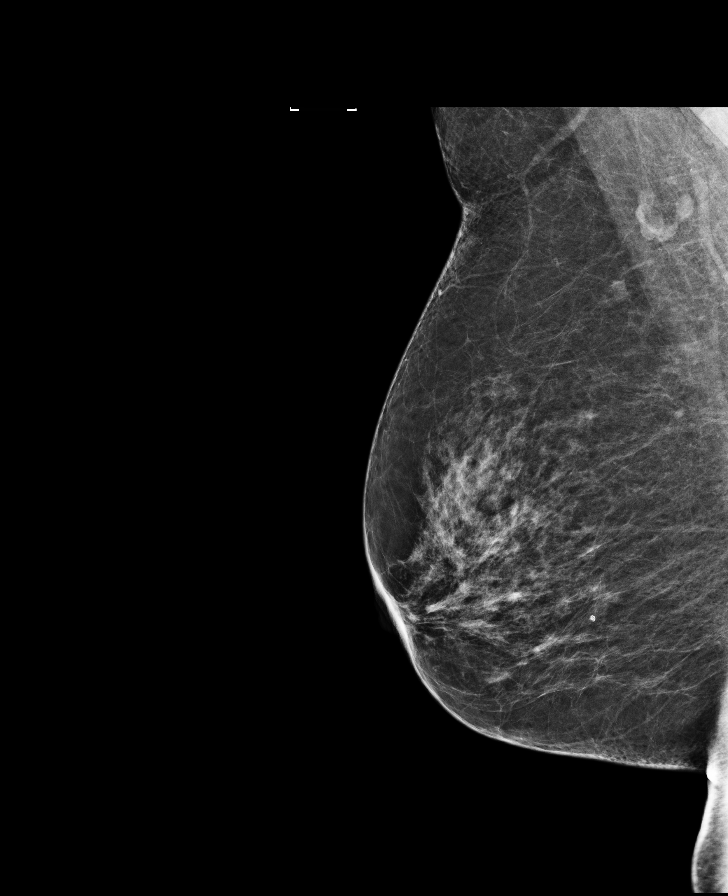

[L MLO]
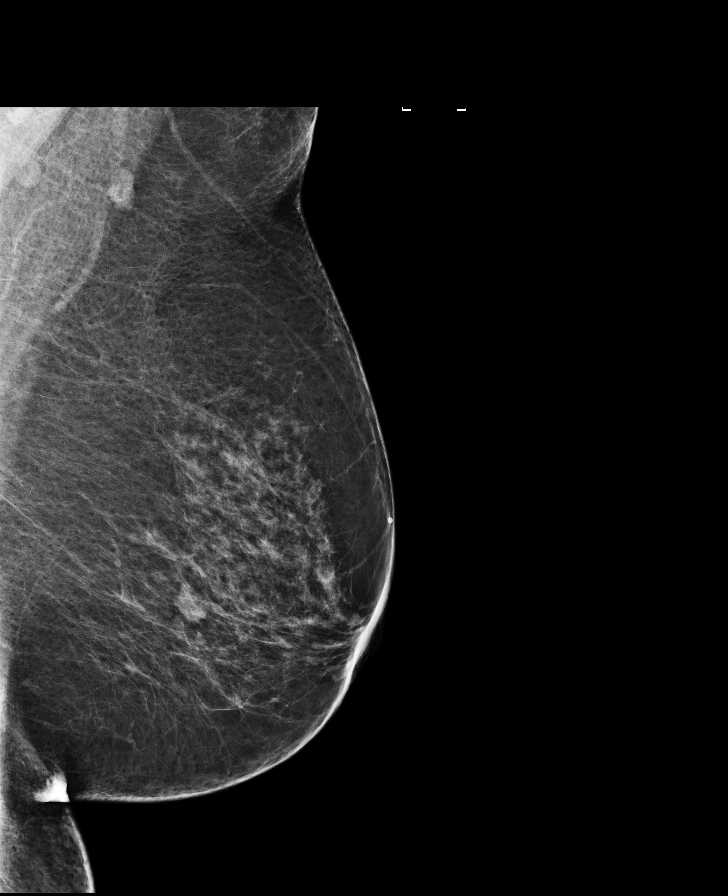

[L CC synth-2D]
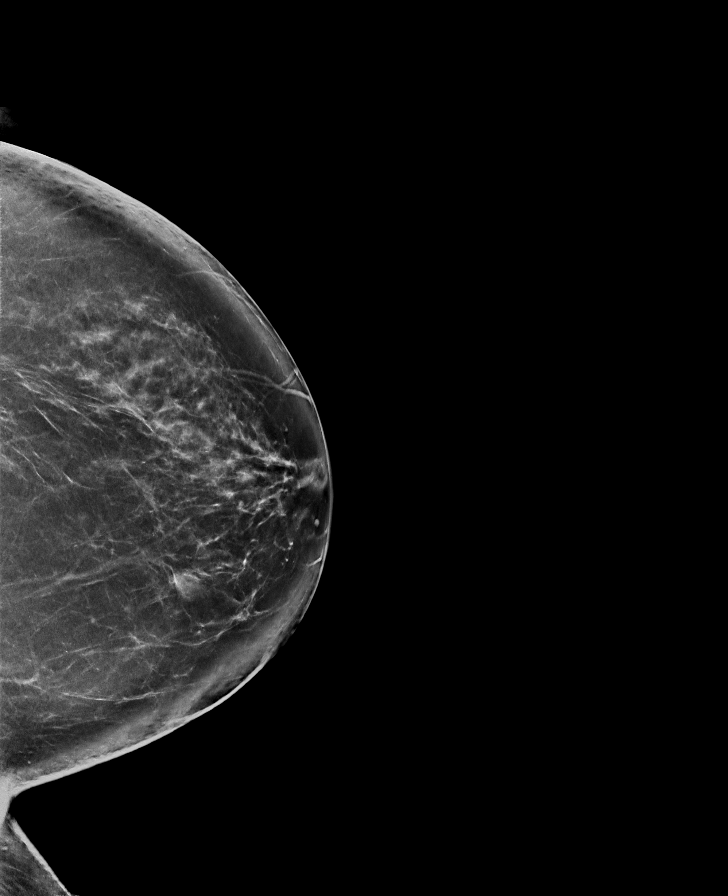

[R CC]
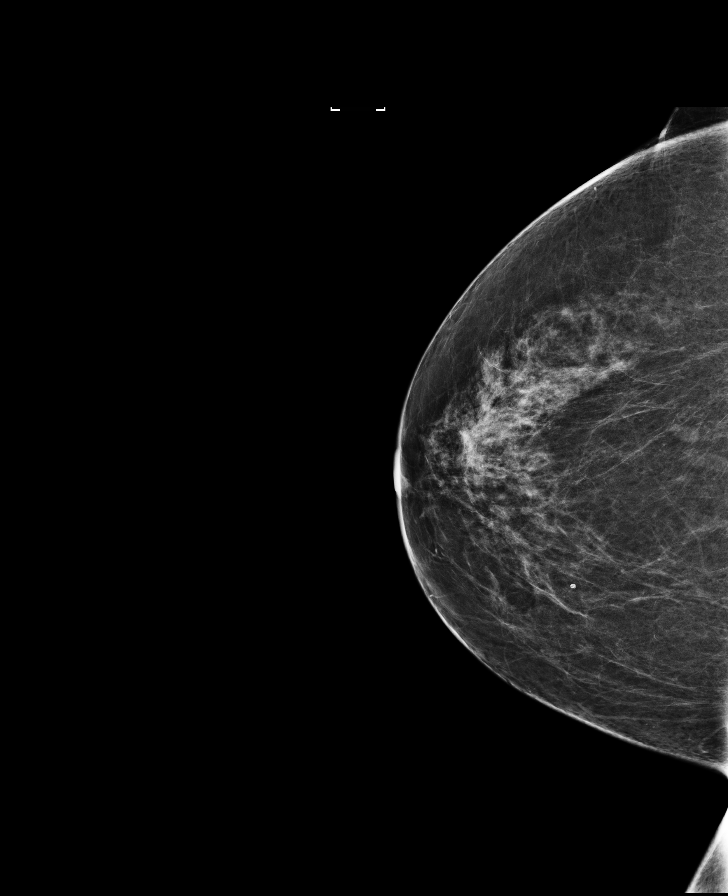

[8 of 28 positions shown; findings below may reference images not displayed]

ACR Breast Density Category b: There are scattered areas of
fibroglandular density.
FINDINGS: There are no findings suspicious for malignancy. Images were
processed with CAD.
IMPRESSION: No mammographic evidence of malignancy. A result letter of this
screening mammogram will be mailed directly to the patient.

RECOMMENDATION:
Screening mammogram in one year. (Code:97-6-RS4)

BI-RADS CATEGORY  1: Negative.

## 2017-03-14 ENCOUNTER — Ambulatory Visit: Payer: Self-pay | Admitting: Registered Nurse

## 2017-03-14 VITALS — BP 130/77 | HR 68 | Temp 97.8°F

## 2017-03-14 DIAGNOSIS — L243 Irritant contact dermatitis due to cosmetics: Secondary | ICD-10-CM

## 2017-03-14 MED ORDER — TRIAMCINOLONE ACETONIDE 0.1 % EX CREA: 1.0000 "application " | TOPICAL_CREAM | Freq: Two times a day (BID) | CUTANEOUS | 0 refills | Status: AC

## 2017-03-14 NOTE — Progress Notes (Signed)
Subjective:    Patient ID: Valerie Sparks, female    DOB: [birth year], 62 y.o.   MRN: [id number]  62y/o caucasian female established Pt reports having dry skin and exfoliating and then having splotches of dry skin with redness and discoloration appear. Present for 2-3 months now. Has been using Cortisone-10 and Eucerin Eczema Cream with some itch relief. Non-painful.  Took zyrtec 10mg  generic yesterday for runny nose also      Review of Systems  Constitutional: Negative for activity change, appetite change, chills, diaphoresis, fatigue and fever.  HENT: Positive for congestion, postnasal drip and rhinorrhea. Negative for ear discharge, ear pain, nosebleeds, sinus pressure, sinus pain, sneezing, sore throat and trouble swallowing.   Eyes: Negative for photophobia and visual disturbance.  Respiratory: Negative for cough, shortness of breath and wheezing.   Cardiovascular: Negative for chest pain.  Gastrointestinal: Negative for nausea and vomiting.  Endocrine: Negative for cold intolerance and heat intolerance.  Genitourinary: Negative for difficulty urinating.  Musculoskeletal: Negative for arthralgias, back pain, gait problem, joint swelling, myalgias, neck pain and neck stiffness.  Skin: Positive for color change and rash. Negative for pallor and wound.  Allergic/Immunologic: Positive for environmental allergies. Negative for food allergies.  Neurological: Negative for dizziness, tremors, weakness, numbness and headaches.  Hematological: Negative for adenopathy. Does not bruise/bleed easily.  Psychiatric/Behavioral: Negative for agitation, confusion and sleep disturbance.       Objective:   Physical Exam  Constitutional: She is oriented to person, place, and time. Vital signs are normal. She appears well-developed and well-nourished. She is active and cooperative.  Non-toxic appearance. She does not have a sickly appearance. She does not appear ill. No distress.  HENT:  Head:  Normocephalic and atraumatic.  Right Ear: Hearing and external ear normal.  Left Ear: Hearing and external ear normal.  Nose: Nose normal.  Mouth/Throat: Uvula is midline, oropharynx is clear and moist and mucous membranes are normal. No oropharyngeal exudate.  Eyes: Conjunctivae, EOM and lids are normal. Pupils are equal, round, and reactive to light. Right eye exhibits no discharge. Left eye exhibits no discharge. No scleral icterus.  Neck: Trachea normal and normal range of motion. Neck supple. No tracheal deviation present.  Cardiovascular: Normal rate, regular rhythm, normal heart sounds and intact distal pulses.  Pulmonary/Chest: Effort normal and breath sounds normal. No accessory muscle usage. No respiratory distress. She has no decreased breath sounds. She has no wheezes. She has no rhonchi. She has no rales.  Abdominal: Soft. Normal appearance. She exhibits no distension, no fluid wave and no ascites. There is no rigidity and no guarding.  Musculoskeletal: Normal range of motion. She exhibits tenderness. She exhibits no edema or deformity.       Right shoulder: Normal.       Left shoulder: Normal.       Right elbow: Normal.      Left elbow: Normal.       Right hip: Normal.       Left hip: Normal.       Right knee: Normal.       Left knee: Normal.       Cervical back: Normal.       Thoracic back: Normal.       Lumbar back: Normal.       Left forearm: She exhibits tenderness. She exhibits no bony tenderness, no swelling, no edema, no deformity and no laceration.       Right hand: Normal.  Left hand: Normal.  Lymphadenopathy:       Head (right side): No submental, no submandibular, no tonsillar, no preauricular, no posterior auricular and no occipital adenopathy present.       Head (left side): No submental, no submandibular, no tonsillar, no preauricular, no posterior auricular and no occipital adenopathy present.    She has no cervical adenopathy.       Right cervical: No  superficial cervical, no deep cervical and no posterior cervical adenopathy present.      Left cervical: No superficial cervical, no deep cervical and no posterior cervical adenopathy present.  Neurological: She is alert and oriented to person, place, and time. She has normal strength. She is not disoriented. She displays no atrophy and no tremor. No cranial nerve deficit or sensory deficit. She exhibits normal muscle tone. She displays no seizure activity. Coordination and gait normal. GCS eye subscore is 4. GCS verbal subscore is 5. GCS motor subscore is 6.  On/off exam table; in/out of chair without difficulty; gait sure and steady in hallway  Skin: Skin is warm, dry and intact. Rash noted. No abrasion, no bruising, no burn, no ecchymosis, no laceration, no lesion, no petechiae and no purpura noted. Rash is macular, papular and maculopapular. Rash is not nodular, not pustular, not vesicular and not urticarial. She is not diaphoretic. There is erythema. No cyanosis. No pallor. Nails show no clubbing.     5x7 plaque left posterior forearm hyperpigmented macular papular coalesced; right flank 2x4 cm plaque; sternum 1x2cm macules/papules grouped dry erythema  Psychiatric: She has a normal mood and affect. Her speech is normal and behavior is normal. Judgment and thought content normal. Cognition and memory are normal.  Nursing note and vitals reviewed.         Assessment & Plan:  A-contact dermatitis  P-Zyrtec 10mg  po BID prn itching OTC.  Hydrocortisone 1% topical BID 4 UD given to patient from clinic stock until able to pick up triamcinolone from pharmacy apply BID and cover with emollient to affected areas x 2 weeks #1 RF0 electronic Rx sent to pharmacy of patient choice.  If spreading consider change to oral prednisone taper x 6-14 days patient wanted to start with topical therapy first.  Medication as directed. Symptomatic therapy suggested. Warm to cool water soaks and/or oatmeal baths. Call  or return to clinic as needed if these symptoms worsen or fail to improve as anticipated e.g. Spreading or signs of infection e.g. Red streaks, discharge, swelling, worsening pain.  If itching worsening notify me as may give Rx medication discussed ice, emollient. Exitcare handout on contact and atopic dermatitis given to patient. Patient verbalized agreement and understanding of treatment plan.  P2: Avoidance and hand washing.

## 2017-03-14 NOTE — Patient Instructions (Signed)
Atopic Dermatitis Atopic dermatitis is a skin disorder that causes inflammation of the skin. This is the most common type of eczema. Eczema is a group of skin conditions that cause the skin to be itchy, red, and swollen. This condition is generally worse during the cooler winter months and often improves during the warm summer months. Symptoms can vary from person to person. Atopic dermatitis usually starts showing signs in infancy and can last through adulthood. This condition cannot be passed from one person to another (non-contagious), but it is more common in families. Atopic dermatitis may not always be present. When it is present, it is called a flare-up. What are the causes? The exact cause of this condition is not known. Flare-ups of the condition may be triggered by:  Contact with something that you are sensitive or allergic to.  Stress.  Certain foods.  Extremely hot or cold weather.  Harsh chemicals and soaps.  Dry air.  Chlorine.  What increases the risk? This condition is more likely to develop in people who have a personal history or family history of eczema, allergies, asthma, or hay fever. What are the signs or symptoms? Symptoms of this condition include:  Dry, scaly skin.  Red, itchy rash.  Itchiness, which can be severe. This may occur before the skin rash. This can make sleeping difficult.  Skin thickening and cracking that can occur over time.  How is this diagnosed? This condition is diagnosed based on your symptoms, a medical history, and a physical exam. How is this treated? There is no cure for this condition, but symptoms can usually be controlled. Treatment focuses on:  Controlling the itchiness and scratching. You may be given medicines, such as antihistamines or steroid creams.  Limiting exposure to things that you are sensitive or allergic to (allergens).  Recognizing situations that cause stress and developing a plan to manage stress.  If  your atopic dermatitis does not get better with medicines, or if it is all over your body (widespread), a treatment using a specific type of light (phototherapy) may be used. Follow these instructions at home: Skin care  Keep your skin well-moisturized. Doing this seals in moisture and helps to prevent dryness. ? Use unscented lotions that have petroleum in them. ? Avoid lotions that contain alcohol or water. They can dry the skin.  Keep baths or showers short (less than 5 minutes) in warm water. Do not use hot water. ? Use mild, unscented cleansers for bathing. Avoid soap and bubble bath. ? Apply a moisturizer to your skin right after a bath or shower.  Do not apply anything to your skin without checking with your health care provider. General instructions  Dress in clothes made of cotton or cotton blends. Dress lightly because heat increases itchiness.  When washing your clothes, rinse your clothes twice so all of the soap is removed.  Avoid any triggers that can cause a flare-up.  Try to manage your stress.  Keep your fingernails cut short.  Avoid scratching. Scratching makes the rash and itchiness worse. It may also result in a skin infection (impetigo) due to a break in the skin caused by scratching.  Take or apply over-the-counter and prescription medicines only as told by your health care provider.  Keep all follow-up visits as told by your health care provider. This is important.  Do not be around people who have cold sores or fever blisters. If you get the infection, it may cause your atopic dermatitis to worsen. Contact   a health care provider if:  Your itchiness interferes with sleep.  Your rash gets worse or it is not better within one week of starting treatment.  You have a fever.  You have a rash flare-up after having contact with someone who has cold sores or fever blisters. Get help right away if:  You develop pus or soft yellow scabs in the rash  area. Summary  This condition causes a red rash and itchy, dry, scaly skin.  Treatment focuses on controlling the itchiness and scratching, limiting exposure to things that you are sensitive or allergic to (allergens), recognizing situations that cause stress, and developing a plan to manage stress.  Keep your skin well-moisturized.  Keep baths or showers shorter than 5 minutes and use warm water. Do not use hot water. This information is not intended to replace advice given to you by your health care provider. Make sure you discuss any questions you have with your health care provider. Document Released: 01/29/2000 Document Revised: 03/04/2016 Document Reviewed: 03/04/2016 Elsevier Interactive Patient Education  2018 [place] Dermatitis Dermatitis is redness, soreness, and swelling (inflammation) of the skin. Contact dermatitis is a reaction to certain substances that touch the skin. There are two types of contact dermatitis:  Irritant contact dermatitis. This type is caused by something that irritates your skin, such as dry hands from washing them too much. This type does not require previous exposure to the substance for a reaction to occur. This type is more common.  Allergic contact dermatitis. This type is caused by a substance that you are allergic to, such as a nickel allergy or poison ivy. This type only occurs if you have been exposed to the substance (allergen) before. Upon a repeat exposure, your body reacts to the substance. This type is less common.  What are the causes? Many different substances can cause contact dermatitis. Irritant contact dermatitis is most commonly caused by exposure to:  Makeup.  Soaps.  Detergents.  Bleaches.  Acids.  Metal salts, such as nickel.  Allergic contact dermatitis is most commonly caused by exposure to:  Poisonous plants.  Chemicals.  Jewelry.  Latex.  Medicines.  Preservatives in products, such as  clothing.  What increases the risk? This condition is more likely to develop in:  People who have jobs that expose them to irritants or allergens.  People who have certain medical conditions, such as asthma or eczema.  What are the signs or symptoms? Symptoms of this condition may occur anywhere on your body where the irritant has touched you or is touched by you. Symptoms include:  Dryness or flaking.  Redness.  Cracks.  Itching.  Pain or a burning feeling.  Blisters.  Drainage of small amounts of blood or clear fluid from skin cracks.  With allergic contact dermatitis, there may also be swelling in areas such as the eyelids, mouth, or genitals. How is this diagnosed? This condition is diagnosed with a medical history and physical exam. A patch skin test may be performed to help determine the cause. If the condition is related to your job, you may need to see an occupational medicine specialist. How is this treated? Treatment for this condition includes figuring out what caused the reaction and protecting your skin from further contact. Treatment may also include:  Steroid creams or ointments. Oral steroid medicines may be needed in more severe cases.  Antibiotics or antibacterial ointments, if a skin infection is present.  Antihistamine lotion or an antihistamine taken by mouth  to ease itching.  A bandage (dressing).  Follow these instructions at home: [place] your skin as needed.  Apply cool compresses to the affected areas.  Try taking a bath with: ? Epsom salts. Follow the instructions on the packaging. You can get these at your local pharmacy or grocery store. ? Baking soda. Pour a small amount into the bath as directed by your health care provider. ? Colloidal oatmeal. Follow the instructions on the packaging. You can get this at your local pharmacy or grocery store.  Try applying baking soda paste to your skin. Stir water into baking soda  until it reaches a paste-like consistency.  Do not scratch your skin.  Bathe less frequently, such as every other day.  Bathe in lukewarm water. Avoid using hot water. Medicines  Take or apply over-the-counter and prescription medicines only as told by your health care provider.  If you were prescribed an antibiotic medicine, take or apply your antibiotic as told by your health care provider. Do not stop using the antibiotic even if your condition starts to improve. General instructions  Keep all follow-up visits as told by your health care provider. This is important.  Avoid the substance that caused your reaction. If you do not know what caused it, keep a journal to try to track what caused it. Write down: ? What you eat. ? What cosmetic products you use. ? What you drink. ? What you wear in the affected area. This includes jewelry.  If you were given a dressing, take care of it as told by your health care provider. This includes when to change and remove it. Contact a health care provider if:  Your condition does not improve with treatment.  Your condition gets worse.  You have signs of infection such as swelling, tenderness, redness, soreness, or warmth in the affected area.  You have a fever.  You have new symptoms. Get help right away if:  You have a severe headache, neck pain, or neck stiffness.  You vomit.  You feel very sleepy.  You notice red streaks coming from the affected area.  Your bone or joint underneath the affected area becomes painful after the skin has healed.  The affected area turns darker.  You have difficulty breathing. This information is not intended to replace advice given to you by your health care provider. Make sure you discuss any questions you have with your health care provider. Document Released: 01/29/2000 Document Revised: 07/09/2015 Document Reviewed: 06/18/2014 Elsevier Interactive Patient Education  2018 [employer].

## 2017-04-18 ENCOUNTER — Ambulatory Visit: Payer: Self-pay | Admitting: Registered Nurse

## 2017-04-18 VITALS — BP 132/69 | HR 68 | Temp 97.8°F

## 2017-04-18 DIAGNOSIS — L209 Atopic dermatitis, unspecified: Secondary | ICD-10-CM

## 2017-04-18 MED ORDER — SALINE SPRAY 0.65 % NA SOLN: 2.0000 | NASAL | 0 refills | Status: DC

## 2017-04-18 MED ORDER — FLUTICASONE PROPIONATE 50 MCG/ACT NA SUSP: 1.0000 | Freq: Two times a day (BID) | NASAL | 6 refills | Status: DC

## 2017-04-18 MED ORDER — PREDNISONE 10 MG (21) PO TBPK: ORAL_TABLET | ORAL | 0 refills | Status: DC

## 2017-04-18 NOTE — Progress Notes (Signed)
Subjective:    Patient ID: Valerie Sparks, female    DOB: [birth year], 62 y.o.   MRN: [id number]  HPI  62y/o caucasian female established Pt reports flare of eczema to previous sites of L FA, low back, and new sites of R temple, behind L ear and on scalp. Has been using Triamcinolone cream, Vaseline, coconut oil, Zyrtec with some relief. Wondering if she needs po steroids at this point. Has been present/spreading over the past 4 months.  Last office visit 03/14/17 started on triamcinolone cream.                 Review of Systems   Constitutional: Negative for activity change, appetite change, chills, diaphoresis, fatigue and fever.  HENT: Positive for congestion, postnasal drip and rhinorrhea. Negative for ear discharge, ear pain, nosebleeds, sinus pressure, sinus pain, sneezing, sore throat and trouble swallowing.   Eyes: Negative for photophobia and visual disturbance.  Respiratory: Negative for cough, shortness of breath and wheezing.   Cardiovascular: Negative for chest pain.  Gastrointestinal: Negative for nausea and vomiting.  Endocrine: Negative for cold intolerance and heat intolerance.  Genitourinary: Negative for difficulty urinating.  Musculoskeletal: Negative for arthralgias, back pain, gait problem, joint swelling, myalgias, neck pain and neck stiffness.  Skin: Positive for color change and rash. Negative for pallor and wound.  Allergic/Immunologic: Positive for environmental allergies. Negative for food allergies.  Neurological: Negative for dizziness, tremors, weakness, numbness and headaches.  Hematological: Negative for adenopathy. Does not bruise/bleed easily.  Psychiatric/Behavioral: Negative for agitation, confusion and sleep disturbance.     Objective:   Physical Exam  Skin:       2x3cm plaque left medial posterior forearm hyperpigmented macular papular coalesced; right flank/low back 4x7 cm plaque; erythema has improved on left forearm but  scaling worsened low back/flank since last office visit Constitutional: She is oriented to person, place, and time. Vital signs are normal. She appears well-developed and well-nourished. She is active and cooperative.  Non-toxic appearance. She does not have a sickly appearance. She does not appear ill. No distress.  HENT:  Head: Normocephalic and atraumatic.  Right Ear: Hearing and external ear normal.  Left Ear: Hearing and external ear normal.  Nose: Nose normal.  Mouth/Throat: Uvula is midline, oropharynx is clear and moist and mucous membranes are normal. No oropharyngeal exudate.  Eyes: Conjunctivae, EOM and lids are normal. Pupils are equal, round, and reactive to light. Right eye exhibits no discharge. Left eye exhibits no discharge. No scleral icterus.  Neck: Trachea normal and normal range of motion. Neck supple. No tracheal deviation present.  Cardiovascular: Normal rate, regular rhythm, normal heart sounds and intact distal pulses.  Pulmonary/Chest: Effort normal and breath sounds normal. No accessory muscle usage. No respiratory distress. She has no decreased breath sounds. She has no wheezes. She has no rhonchi. She has no rales.  Abdominal: Soft. Normal appearance. She exhibits no distension, no fluid wave and no ascites. There is no rigidity and no guarding.  Musculoskeletal: Normal range of motion. She exhibits tenderness. She exhibits no edema or deformity.       Right shoulder: Normal.       Left shoulder: Normal.       Right elbow: Normal.      Left elbow: Normal.       Right hip: Normal.       Left hip: Normal.       Right knee: Normal.       Left knee: Normal.  Cervical back: Normal.       Thoracic back: Normal.       Lumbar back: Normal.       Left forearm: She exhibits tenderness. She exhibits no bony tenderness, no swelling, no edema, no deformity and no laceration.       Right hand: Normal.       Left hand: Normal.  Lymphadenopathy:       Head (right side):  No submental, no submandibular, no tonsillar, no preauricular, no posterior auricular and no occipital adenopathy present.       Head (left side): No submental, no submandibular, no tonsillar, no preauricular, no posterior auricular and no occipital adenopathy present.    She has no cervical adenopathy.       Right cervical: No superficial cervical, no deep cervical and no posterior cervical adenopathy present.      Left cervical: No superficial cervical, no deep cervical and no posterior cervical adenopathy present.  Neurological: She is alert and oriented to person, place, and time. She has normal strength. She is not disoriented. She displays no atrophy and no tremor. No cranial nerve deficit or sensory deficit. She exhibits normal muscle tone. She displays no seizure activity. Coordination and gait normal. GCS eye subscore is 4. GCS verbal subscore is 5. GCS motor subscore is 6.  On/off exam table; in/out of chair without difficulty; gait sure and steady in hallway  Skin: Skin is warm, dry and intact. Rash noted. No abrasion, no bruising, no burn, no ecchymosis, no laceration, no lesion, no petechiae and no purpura noted. Rash is macular, papular and maculopapular. Rash is not nodular, not pustular, not vesicular and not urticarial. She is not diaphoretic. There is erythema. No cyanosis. No pallor. Nails show no clubbing.  Psychiatric: She has a normal mood and affect. Her speech is normal and behavior is normal. Judgment and thought content normal. Cognition and memory are normal.  Nursing note and vitals reviewed.       Assessment & Plan:  A-atopic dermatitis  P-Ribs and sternum lesions resolved but now scalp/ear popped up.  Improving forearm and worsening flank/low back.  Zyrtec 10mg  po BID prn itching OTC. Emollient at least BID if not TID/QID when noticing dry skin/flaking.  Prednisone 10mg  po taper with breakfast 60/50/40/30/20/10mg  #21 RF0 will consider extending duration if improving  but not resolving.   Medication as directed. Symptomatic therapy suggested. Warm to cool water soaks and/or oatmeal baths. Call or return to clinic as needed if these symptoms worsen or fail to improve as anticipated e.g. Spreading or signs of infection e.g. Red streaks, discharge, swelling, worsening pain.  If itching worsening notify me as may give Rx medication discussed ice, emollient. Exitcare handout on  atopic dermatitis given to patient. Patient verbalized agreement and understanding of treatment plan.  P2: Avoidance and hand washing.

## 2017-04-18 NOTE — Patient Instructions (Signed)

## 2017-06-05 ENCOUNTER — Ambulatory Visit: Payer: PRIVATE HEALTH INSURANCE | Admitting: Primary Care

## 2017-06-05 ENCOUNTER — Encounter: Payer: Self-pay | Admitting: Primary Care

## 2017-06-05 VITALS — BP 124/76 | HR 74 | Temp 98.0°F | Ht 63.0 in | Wt 176.8 lb

## 2017-06-05 DIAGNOSIS — F341 Dysthymic disorder: Secondary | ICD-10-CM

## 2017-06-05 DIAGNOSIS — L309 Dermatitis, unspecified: Secondary | ICD-10-CM | POA: Diagnosis not present

## 2017-06-05 DIAGNOSIS — R7303 Prediabetes: Secondary | ICD-10-CM

## 2017-06-05 DIAGNOSIS — E782 Mixed hyperlipidemia: Secondary | ICD-10-CM | POA: Diagnosis not present

## 2017-06-05 DIAGNOSIS — E559 Vitamin D deficiency, unspecified: Secondary | ICD-10-CM

## 2017-06-05 MED ORDER — TRIAMCINOLONE ACETONIDE 0.5 % EX OINT: 1.0000 "application " | TOPICAL_OINTMENT | Freq: Two times a day (BID) | CUTANEOUS | 0 refills | Status: DC

## 2017-06-05 MED ORDER — SIMVASTATIN 20 MG PO TABS: ORAL_TABLET | ORAL | 1 refills | Status: DC

## 2017-06-05 NOTE — Progress Notes (Signed)
Subjective:    Patient ID: Valerie Sparks, female    DOB: Jul 14, 1955, 62 y.o.   MRN: [id number]  HPI  Valerie Sparks is a 62 year old female who presents today to transfer care from Dr. Deborra Medina.  1) Hyperlipidemia: Currently managed on Simvastatin 20 mg, needing refills today. Last lipid panel was in August 2018 with LDL of 101, Trigs of 157, TC of 184.   2) Rash: Located to scalp, behind ears, bilateral upper extremities, lower back, under her abdominal folds intermittently for the past 4-6 weeks. She was treated in March 2019 with oral prednisone and triamcinolone cream. Her rash resolved after taking prednisone, returned after she completed prednisone. The rashes have dried up without resolve with triamcinolone 0.1% cream. She has not seen dermatology. She has no history of eczema or recurring rashes in the past.   3) Prediabetes: A1C of 5.8 in August 2018, also in March 2018. She has stared to reduce carbohydrate and sugar intake in her diet. 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    Wt Readings from Last 3 Encounters:  06/05/17 176 lb 12.8 oz (80.2 kg)  10/04/16 174 lb (78.9 kg)  05/18/16 175 lb 8 oz (79.6 kg)     Review of Systems  Eyes: Negative for visual disturbance.  Respiratory: Negative for shortness of breath.   Cardiovascular: Negative for chest pain.  Musculoskeletal: Negative for myalgias.  Skin: Positive for rash.  Neurological: Negative for dizziness and headaches.        Past Medical History:  Diagnosis Date  . Allergy   . Frequent headaches   . Hyperlipidemia      Social History   Socioeconomic History  . Marital status: Married    Spouse name: Not on file  . Number of children: Not on file  . Years of education: Not on file  . Highest education level: Not on file  Occupational History  . Not on file  Social Needs  . Financial resource strain: Not on file  . Food insecurity:    Worry: Not on file    Inability:  Not on file  . Transportation needs:    Medical: Not on file    Non-medical: Not on file  Tobacco Use  . Smoking status: Never Smoker  . Smokeless tobacco: Never Used  Substance and Sexual Activity  . Alcohol use: Yes    Comment: very rarely  . Drug use: No  . Sexual activity: Yes  Lifestyle  . Physical activity:    Days per week: Not on file    Minutes per session: Not on file  . Stress: Not on file  Relationships  . Social connections:    Talks on phone: Not on file    Gets together: Not on file    Attends religious service: Not on file    Active member of club or organization: Not on file    Attends meetings of clubs or organizations: Not on file    Relationship status: Not on file  . Intimate partner violence:    Fear of current or ex partner: Not on file    Emotionally abused: Not on file    Physically abused: Not on file    Forced sexual activity: Not on file  Other Topics Concern  . Not on file  Social History Narrative  . Not on file    Past Surgical History:  Procedure Laterality Date  . ABLATION    .  BUNIONECTOMY     right  . TOOTH EXTRACTION    . TUBAL LIGATION      Family History  Problem Relation Age of Onset  . Hypertension Mother   . Diabetes Mother   . Heart disease Father   . Diabetes Sister   . Diabetes Brother   . Breast cancer Paternal Aunt 64  . Cancer Maternal Grandmother   . Kidney disease Paternal Grandfather   . Colon cancer Neg Hx   . Esophageal cancer Neg Hx   . Rectal cancer Neg Hx   . Stomach cancer Neg Hx     Allergies  Allergen Reactions  . Actifed Cold-Allergy [Chlorpheniramine-Phenyleph Er]   . Pseudoephedrine Other (See Comments)    "takes me out of my skin"    Current Outpatient Medications on File Prior to Visit  Medication Sig Dispense Refill  . cetirizine (ZYRTEC) 10 MG tablet Take 10 mg by mouth daily.    . fluticasone (FLONASE) 50 MCG/ACT nasal spray Place 1 spray into both nostrils 2 (two) times daily. 16  g 6  . sodium chloride (OCEAN) 0.65 % SOLN nasal spray Place 2 sprays into both nostrils every 2 (two) hours while awake.  0   No current facility-administered medications on file prior to visit.     BP 124/76   Pulse 74   Temp 98 F (36.7 C) (Oral)   Ht 5\' 3"  (1.6 m)   Wt 176 lb 12.8 oz (80.2 kg)   SpO2 98%   BMI 31.32 kg/m    Objective:   Physical Exam  Constitutional: She appears well-nourished.  Neck: Neck supple.  Cardiovascular: Normal rate and regular rhythm.  Pulmonary/Chest: Effort normal and breath sounds normal.  Skin: Skin is warm and dry. Rash noted.  Flesh colored, slightly scaly oblong shaped rashes to bilateral upper extremities, behind both ears, lower back. No erythema, no vesicles or open lesions.  Psychiatric: She has a normal mood and affect.          Assessment & Plan:

## 2017-06-05 NOTE — Assessment & Plan Note (Signed)
Rashes today representative of eczema. Will trial higher potency triamcinolone and switch to ointment for hopeful resolve. Consider sending to dermatology if no improvement/resolve.

## 2017-06-05 NOTE — Assessment & Plan Note (Signed)
Previously managed on Ativan intermittently, didn't like the way she felt on that medication. This was during a time of increased stress with her sick mother. Has been off of Ativan for years and is doing well.

## 2017-06-05 NOTE — Assessment & Plan Note (Signed)
Uterine ablation in 2011-2012, no vaginal bleeding since.

## 2017-06-05 NOTE — Assessment & Plan Note (Signed)
A1C of 5.8 in August 2018, also in March 2018. Will repeat at upcoming physical.

## 2017-06-05 NOTE — Assessment & Plan Note (Signed)
Lipid panel in August 2018 stable. Continue Simvastatin 20 mg, repeat in August 2019.

## 2017-06-05 NOTE — Patient Instructions (Signed)
Apply the triamcinolone ointment twice daily for one week, then as needed. Do not apply to the face.  Start exercising. You should be getting 150 minutes of moderate intensity exercise weekly.  It's important to improve your diet by reducing consumption of fast food, fried food, processed snack foods, sugary drinks. Increase consumption of fresh vegetables and fruits, whole grains, water.  Ensure you are drinking 64 ounces of water daily.  Please schedule a physical with me in August/September. You may also schedule a lab only appointment 3-4 days prior. We will discuss your lab results in detail during your physical.  It was a pleasure to see you today!

## 2017-06-05 NOTE — Assessment & Plan Note (Signed)
Vitamin D level stable from labs in August 2018, compliant to 1000 mcg once daily. Repeat in August 2019.

## 2017-09-13 ENCOUNTER — Encounter: Payer: Self-pay | Admitting: Primary Care

## 2017-09-13 ENCOUNTER — Ambulatory Visit (INDEPENDENT_AMBULATORY_CARE_PROVIDER_SITE_OTHER)
Admission: RE | Admit: 2017-09-13 | Discharge: 2017-09-13 | Disposition: A | Discharge status: Home / Self Care | Payer: No Typology Code available for payment source | Source: Ambulatory Visit | Attending: Primary Care | Admitting: Primary Care

## 2017-09-13 ENCOUNTER — Ambulatory Visit: Payer: No Typology Code available for payment source | Admitting: Primary Care

## 2017-09-13 VITALS — BP 140/80 | HR 78 | Temp 98.1°F | Ht 63.0 in | Wt 174.8 lb

## 2017-09-13 DIAGNOSIS — M79602 Pain in left arm: Secondary | ICD-10-CM

## 2017-09-13 DIAGNOSIS — R202 Paresthesia of skin: Secondary | ICD-10-CM | POA: Diagnosis not present

## 2017-09-13 DIAGNOSIS — M79601 Pain in right arm: Secondary | ICD-10-CM | POA: Diagnosis not present

## 2017-09-13 DIAGNOSIS — M2550 Pain in unspecified joint: Secondary | ICD-10-CM

## 2017-09-13 DIAGNOSIS — R200 Anesthesia of skin: Secondary | ICD-10-CM | POA: Diagnosis not present

## 2017-09-13 LAB — VITAMIN B12: VITAMIN B 12: 370 pg/mL (ref 211–911)

## 2017-09-13 LAB — BASIC METABOLIC PANEL
BUN: 13 mg/dL (ref 6–23)
CALCIUM: 9.5 mg/dL (ref 8.4–10.5)
CO2: 30 mEq/L (ref 19–32)
Chloride: 104 mEq/L (ref 96–112)
Creatinine, Ser: 0.74 mg/dL (ref 0.40–1.20)
GFR: 84.68 mL/min (ref >60.00)
GLUCOSE: 124 mg/dL — AB (ref 70–99)
Potassium: 4.3 mEq/L (ref 3.5–5.1)
SODIUM: 140 meq/L (ref 135–145)

## 2017-09-13 LAB — CBC
HCT: 42.8 % (ref 36.0–46.0)
Hemoglobin: 14.7 g/dL (ref 12.0–15.0)
MCHC: 34.3 g/dL (ref 30.0–36.0)
MCV: 82.5 fl (ref 78.0–100.0)
Platelets: 220 10*3/uL (ref 150.0–400.0)
RBC: 5.19 Mil/uL — AB (ref 3.87–5.11)
RDW: 13.7 % (ref 11.5–15.5)
WBC: 4.7 10*3/uL (ref 4.0–10.5)

## 2017-09-13 LAB — SEDIMENTATION RATE: SED RATE: 22 mm/h (ref 0–30)

## 2017-09-13 LAB — TSH: TSH: 1.35 u[IU]/mL (ref 0.35–4.50)

## 2017-09-13 LAB — HEMOGLOBIN A1C: Hgb A1c MFr Bld: 6.2 % (ref 4.6–6.5)

## 2017-09-13 MED ORDER — PREDNISONE 10 MG PO TABS: ORAL_TABLET | ORAL | 0 refills | Status: DC

## 2017-09-13 NOTE — Progress Notes (Signed)
Subjective:    Patient ID: Valerie Sparks, female    DOB: 14-Sep-1955, 62 y.o.   MRN: [id number]  HPI  Valerie Sparks is a 62 year old female with a history of hyperlipidemia, prediabetes, cervical and thoracic bulging discs who presents today with a chief complaint of extremity pain and numbness.  Her pain is located to the bilateral upper extremities (elbow, wrist, knuckles). She describes her pain as a constant "achiness" that is now waking her from sleep during the night. She's tried Biofreeze, Aleeve BID, Ibuprofen 800 mg, Ice/heat without much improvement. Symptoms have been present for the last 3 weeks total.   Today she reports numbness to bilateral hands with radiation up to her bilateral forearms. Symptoms are worse with gripping, also with some weakness. She denies headaches, fevers, cough/cold symptoms, neck pain, recent injury/trauma.   She was evaluated by a nurse practitioner at her occupation who recommended she get further testing.   Review of Systems  Musculoskeletal: Positive for arthralgias, joint swelling and myalgias. Negative for back pain and neck pain.  Skin: Negative for color change.  Neurological: Positive for numbness.       Past Medical History:  Diagnosis Date  . Allergy   . Frequent headaches   . Hyperlipidemia      Social History   Socioeconomic History  . Marital status: Married    Spouse name: Not on file  . Number of children: Not on file  . Years of education: Not on file  . Highest education level: Not on file  Occupational History  . Not on file  Social Needs  . Financial resource strain: Not on file  . Food insecurity:    Worry: Not on file    Inability: Not on file  . Transportation needs:    Medical: Not on file    Non-medical: Not on file  Tobacco Use  . Smoking status: Never Smoker  . Smokeless tobacco: Never Used  Substance and Sexual Activity  . Alcohol use: Yes    Comment: very rarely  . Drug use: No  . Sexual  activity: Yes  Lifestyle  . Physical activity:    Days per week: Not on file    Minutes per session: Not on file  . Stress: Not on file  Relationships  . Social connections:    Talks on phone: Not on file    Gets together: Not on file    Attends religious service: Not on file    Active member of club or organization: Not on file    Attends meetings of clubs or organizations: Not on file    Relationship status: Not on file  . Intimate partner violence:    Fear of current or ex partner: Not on file    Emotionally abused: Not on file    Physically abused: Not on file    Forced sexual activity: Not on file  Other Topics Concern  . Not on file  Social History Narrative  . Not on file    Past Surgical History:  Procedure Laterality Date  . ABLATION    . BUNIONECTOMY     right  . TOOTH EXTRACTION    . TUBAL LIGATION      Family History  Problem Relation Age of Onset  . Hypertension Mother   . Diabetes Mother   . Heart disease Father   . Diabetes Sister   . Diabetes Brother   . Breast cancer Paternal Aunt 37  . Cancer Maternal  Grandmother   . Kidney disease Paternal Grandfather   . Colon cancer Neg Hx   . Esophageal cancer Neg Hx   . Rectal cancer Neg Hx   . Stomach cancer Neg Hx     Allergies  Allergen Reactions  . Actifed Cold-Allergy [Chlorpheniramine-Phenyleph Er]   . Pseudoephedrine Other (See Comments)    "takes me out of my skin"    Current Outpatient Medications on File Prior to Visit  Medication Sig Dispense Refill  . cetirizine (ZYRTEC) 10 MG tablet Take 10 mg by mouth daily.    . fluticasone (FLONASE) 50 MCG/ACT nasal spray Place 1 spray into both nostrils 2 (two) times daily. 16 g 6  . simvastatin (ZOCOR) 20 MG tablet Take 1 tablet by mouth every evening for cholesterol. 90 tablet 1  . sodium chloride (OCEAN) 0.65 % SOLN nasal spray Place 2 sprays into both nostrils every 2 (two) hours while awake.  0  . triamcinolone ointment (KENALOG) 0.5 % Apply 1  application topically 2 (two) times daily. 30 g 0   No current facility-administered medications on file prior to visit.     BP 140/80   Pulse 78   Temp 98.1 F (36.7 C) (Oral)   Ht 5\' 3"  (1.6 m)   Wt 174 lb 12 oz (79.3 kg)   SpO2 98%   BMI 30.96 kg/m    Objective:   Physical Exam  Constitutional: She appears well-nourished.  Cardiovascular: Normal rate.  Pulses:      Radial pulses are 2+ on the right side, and 2+ on the left side.  Respiratory: Effort normal.  Musculoskeletal:       Cervical back: She exhibits normal range of motion, no tenderness and no pain.       Arms: Grips equal bilaterally. No obvious joint swelling on exam. Normal color.   Neurological:  Questionable positive Phalen's sign. Negative Tinel's sign.  Skin: Skin is warm and dry.           Assessment & Plan:  Extremity Pain/Numbness and Tingling:  Present for the last 3 weeks. Differentials include B12 deficiency, carpal tunnel, cervical spinal involvement, osteo/rheumatoid arthritis.  Check labs today including B12, TSH, CBC, CCP, Sed Rate, BMP. Plain films of cervical spine pending. Discussed use of wrist splints/braces for potential carpal tunnel. Rx for low dose prednisone course sent to pharmacy. Hold NSAID's.  Pleas Koch, NP

## 2017-09-13 NOTE — Patient Instructions (Addendum)
Stop by the lab and xray prior to leaving today. I will notify you of your results once received.   Try wearing a brace to the extremities for rest. Consider wearing at night and during work hours. Make sure to take of when resting at home.   Start prednisone tablets. Take three tablets for 2 days, then two tablets for 2 days, then one tablet for 2 days.  It was a pleasure to see you today!   Carpal Tunnel Syndrome Carpal tunnel syndrome is a condition that causes pain in your hand and arm. The carpal tunnel is a narrow area located on the palm side of your wrist. Repeated wrist motion or certain diseases may cause swelling within the tunnel. This swelling pinches the main nerve in the wrist (median nerve). What are the causes? This condition may be caused by:  Repeated wrist motions.  Wrist injuries.  Arthritis.  A cyst or tumor in the carpal tunnel.  Fluid buildup during pregnancy.  Sometimes the cause of this condition is not known. What increases the risk? This condition is more likely to develop in:  People who have jobs that cause them to repeatedly move their wrists in the same motion, such as Art gallery manager.  Women.  People with certain conditions, such as: ? Diabetes. ? Obesity. ? An underactive thyroid (hypothyroidism). ? Kidney failure.  What are the signs or symptoms? Symptoms of this condition include:  A tingling feeling in your fingers, especially in your thumb, index, and middle fingers.  Tingling or numbness in your hand.  An aching feeling in your entire arm, especially when your wrist and elbow are bent for long periods of time.  Wrist pain that goes up your arm to your shoulder.  Pain that goes down into your palm or fingers.  A weak feeling in your hands. You may have trouble grabbing and holding items.  Your symptoms may feel worse during the night. How is this diagnosed? This condition is diagnosed with a medical history and  physical exam. You may also have tests, including:  An electromyogram (EMG). This test measures electrical signals sent by your nerves into the muscles.  X-rays.  How is this treated? Treatment for this condition includes:  Lifestyle changes. It is important to stop doing or modify the activity that caused your condition.  Physical or occupational therapy.  Medicines for pain and inflammation. This may include medicine that is injected into your wrist.  A wrist splint.  Surgery.  Follow these instructions at home: If you have a splint:  Wear it as told by your health care provider. Remove it only as told by your health care provider.  Loosen the splint if your fingers become numb and tingle, or if they turn cold and blue.  Keep the splint clean and dry. General instructions  Take over-the-counter and prescription medicines only as told by your health care provider.  Rest your wrist from any activity that may be causing your pain. If your condition is work related, talk to your employer about changes that can be made, such as getting a wrist pad to use while typing.  If directed, apply ice to the painful area: ? Put ice in a plastic bag. ? Place a towel between your skin and the bag. ? Leave the ice on for 20 minutes, 2-3 times per day.  Keep all follow-up visits as told by your health care provider. This is important.  Do any exercises as told by your  health care provider, physical therapist, or occupational therapist. Contact a health care provider if:  You have new symptoms.  Your pain is not controlled with medicines.  Your symptoms get worse. This information is not intended to replace advice given to you by your health care provider. Make sure you discuss any questions you have with your health care provider. Document Released: 01/29/2000 Document Revised: 06/11/2015 Document Reviewed: 06/18/2014 Elsevier Interactive Patient Education  [employer].

## 2017-09-14 ENCOUNTER — Telehealth: Payer: Self-pay | Admitting: Primary Care

## 2017-09-14 LAB — CYCLIC CITRUL PEPTIDE ANTIBODY, IGG

## 2017-09-14 NOTE — Telephone Encounter (Signed)
Copied from [place] [id number]. Topic: Quick Communication - See Telephone Encounter >> Sep 14, 2017  1:38 PM Vernona Rieger wrote: CRM for notification. See Telephone encounter for: 09/14/17.  Patient said she just missed a call from the office and she thinks it was about her labs. Please call patient back

## 2017-09-15 NOTE — Telephone Encounter (Signed)
Called patient and results given as instructed.

## 2017-09-29 ENCOUNTER — Telehealth: Payer: Self-pay

## 2017-09-29 ENCOUNTER — Telehealth: Payer: Self-pay | Admitting: Primary Care

## 2017-09-29 DIAGNOSIS — M2550 Pain in unspecified joint: Secondary | ICD-10-CM

## 2017-09-29 DIAGNOSIS — E782 Mixed hyperlipidemia: Secondary | ICD-10-CM

## 2017-09-29 DIAGNOSIS — R200 Anesthesia of skin: Secondary | ICD-10-CM

## 2017-09-29 DIAGNOSIS — R202 Paresthesia of skin: Principal | ICD-10-CM

## 2017-09-29 MED ORDER — SIMVASTATIN 20 MG PO TABS: ORAL_TABLET | ORAL | 3 refills | Status: DC

## 2017-09-29 NOTE — Telephone Encounter (Signed)
I spoke with pt; pt saw Gentry Fitz NP due to numbness in her arms; prednisone helped some; pt still having numbness in arms. Pt request referral to neurologist.

## 2017-09-29 NOTE — Telephone Encounter (Signed)
Please notify patient that I agree to the neurology referral and placed this today. She should be hearing back from someone soon regarding an appointment.

## 2017-09-29 NOTE — Telephone Encounter (Signed)
Copied from [place] [id number]. Topic: Quick Communication - See Telephone Encounter >> Sep 29, 2017  4:01 PM Rutherford Nail, [place] wrote: CRM for notification. See Telephone encounter for: 09/29/17. Patient calling and states that she had forgot to ask K. Clark about getting a paper prescription for simvastatin (ZOCOR) 20 MG tablet be written so she can take it to her work and get it filled for free. Please advise. Patient can pick up anytime after 3:30pm

## 2017-09-29 NOTE — Telephone Encounter (Signed)
Copied from [place] [id number]. Topic: Referral - Request >> Sep 29, 2017  3:59 PM Rutherford Nail, [place] wrote: Reason for CRM: Patient calling to request a referral be placed for neurology.  CB#: [phone]

## 2017-09-29 NOTE — Telephone Encounter (Signed)
That's fine but we need a repeat lipid panel first. She can get the lab completed and pick up the Rx on the same day. I'll contact her if we need to make changes. Rx printed and placed in Chan's inbox, just make sure she gets a lipid panel drawn first.

## 2017-10-02 NOTE — Telephone Encounter (Signed)
Please notify the patient that a lipid panel was not drawn during her last visit as she was being evaluated for her numbness/tingling.  We did not do her routine annual labs as that is typically done during a physical. Please thank her for agreeing to return for fasting cholesterol check.

## 2017-10-02 NOTE — Telephone Encounter (Signed)
FYI Spoke with patient about coming back for lab work and she stated that when she was here last she asked for complete panel of lab work and she was fasting. She was upset that this was not done, she commented that a lot of tubes of blood were taking at that time and she is just now getting over the bruising after, she had to be stuck twice patient said. She did not know that everything was not ordered even though she asked for full panel. I apologized to patient several times. Patient states she has enough of medication for about 2 weeks and will call back to schedule for labs appointment.-Valerie Sparks, RMA

## 2017-10-02 NOTE — Telephone Encounter (Signed)
Per DPR, left detail message of Kate Clark's comments for patient to call back 

## 2017-10-04 MED ORDER — MELOXICAM 15 MG PO TABS: ORAL_TABLET | ORAL | 1 refills | Status: DC

## 2017-10-04 NOTE — Telephone Encounter (Signed)
Spoken and notified patient of Valerie Millers comments. Patient is agreeable to take Meloxicam. Please send Rx to [facility] in [facility]

## 2017-10-04 NOTE — Telephone Encounter (Signed)
Noted. We can try a prescription strength anti-inflammatory medication to help with her symptoms until she's seen by Neurology. This is called Meloxicam. Would she like to try?  I'd call her myself to discuss but I'm at a conference for the week.

## 2017-10-04 NOTE — Telephone Encounter (Signed)
Spoken and notified patient of Valerie Sparks comments on 10/03/2016 in the afternoon. Lab appt on 10/06/2017.   FYI.Marland KitchenMarland KitchenMarland KitchenPatient also wanted to let [name] know that she is upset that she have to wait until October to see Neurology. Patient stated that she feels like it was a waste of time coming here.I have apologize for the inconvenience and for her to be patient. She is also on the cancellation list so there is a chance she may be seen sooner but she is not happy.

## 2017-10-04 NOTE — Telephone Encounter (Signed)
Noted, Rx sent to [facility].  

## 2017-10-06 ENCOUNTER — Other Ambulatory Visit (INDEPENDENT_AMBULATORY_CARE_PROVIDER_SITE_OTHER): Payer: No Typology Code available for payment source

## 2017-10-06 DIAGNOSIS — E782 Mixed hyperlipidemia: Secondary | ICD-10-CM | POA: Diagnosis not present

## 2017-10-06 LAB — LIPID PANEL
CHOL/HDL RATIO: 4
Cholesterol: 173 mg/dL (ref 0–200)
HDL: 39.6 mg/dL (ref >39.00)
NONHDL: 133.67
TRIGLYCERIDES: 212 mg/dL — AB (ref 0.0–149.0)
VLDL: 42.4 mg/dL — ABNORMAL HIGH (ref 0.0–40.0)

## 2017-10-06 LAB — HEPATIC FUNCTION PANEL
ALT: 15 U/L (ref 0–35)
AST: 18 U/L (ref 0–37)
Albumin: 4 g/dL (ref 3.5–5.2)
Alkaline Phosphatase: 45 U/L (ref 39–117)
BILIRUBIN DIRECT: 0.1 mg/dL (ref 0.0–0.3)
BILIRUBIN TOTAL: 0.5 mg/dL (ref 0.2–1.2)
Total Protein: 6.5 g/dL (ref 6.0–8.3)

## 2017-10-06 LAB — LDL CHOLESTEROL, DIRECT: Direct LDL: 102 mg/dL

## 2017-10-09 ENCOUNTER — Encounter: Payer: Self-pay | Admitting: *Deleted

## 2017-10-25 NOTE — Telephone Encounter (Signed)
This has been notified patient to patient when she had lab appt

## 2017-10-26 ENCOUNTER — Encounter: Payer: Self-pay | Admitting: Registered Nurse

## 2017-10-26 ENCOUNTER — Ambulatory Visit: Payer: Self-pay | Admitting: Registered Nurse

## 2017-10-26 VITALS — BP 141/86 | HR 72 | Temp 97.3°F

## 2017-10-26 DIAGNOSIS — W57XXXA Bitten or stung by nonvenomous insect and other nonvenomous arthropods, initial encounter: Principal | ICD-10-CM

## 2017-10-26 DIAGNOSIS — M67442 Ganglion, left hand: Secondary | ICD-10-CM

## 2017-10-26 DIAGNOSIS — R5383 Other fatigue: Secondary | ICD-10-CM

## 2017-10-26 DIAGNOSIS — R202 Paresthesia of skin: Secondary | ICD-10-CM

## 2017-10-26 DIAGNOSIS — R21 Rash and other nonspecific skin eruption: Secondary | ICD-10-CM

## 2017-10-26 DIAGNOSIS — S30861A Insect bite (nonvenomous) of abdominal wall, initial encounter: Secondary | ICD-10-CM

## 2017-10-26 MED ORDER — DOXYCYCLINE HYCLATE 100 MG PO TABS: 100.0000 mg | ORAL_TABLET | Freq: Two times a day (BID) | ORAL | 0 refills | Status: AC

## 2017-10-26 NOTE — Progress Notes (Signed)
Subjective:    Patient ID: Valerie Sparks, female    DOB: [birth year], 62 y.o.   MRN: [id number]  62y/o Caucasian established female pt c/o numbness and pain to bil arms, and at times, bil shoulders and c-spine. Her bilateral hand grip is  decreased at times. Usually can pick up a coffee cup with just some pain with one hand, but at other times will need to pick up with both hands. Pain worse with lifting more than a few pounds. Pain can be burning, shooting, or achy.  Worst usually upon awakening side sleeper and curls hands under pillow trying to sleep on back but worsens her sleep wakes up in middle of night.  All fingers/upper and lower arms sometimes symptomatic intermittent. Xrays have been performed showing compression in cervical vertebraes but could not r/o pinched nerve. Has MRI scheduled 9/23 and one session of PT the week prior to that. She is wondering if tick bite from May-June 2019 could be related or possibly causing sx like 2/2 Lyme disease. Arm pain began approx 3 months ago and she believes pain started approx 1 month or a little less after tick bite. Pain is contained to arms, shoulders and c-spine. Pain will wake her up 3-4x a night when it becomes 10/10. During the day if she is distracted working, pain is on avg 5/10. Has tried Toradol injection, Toradol po, and Mobic po without relief. Takes Ibuprofen 800mg  TID. Denies n/v, HA. Did not have bullseye rash at tick bite or require abx or add'l treatment outside of cleaning site per RN Hildred Alamin who evaluated patient.  Patient reported May 2019 tick bite right anterior hip/low abdomen had bullseye rash red around bite but she did not report to me or PCM  Fatigue worsening now would like lyme disease treatment     Review of Systems  Constitutional: Positive for fatigue. Negative for activity change, appetite change, chills, diaphoresis and fever.  HENT: Negative for congestion, ear pain, facial swelling, hearing loss, mouth sores,  nosebleeds, sore throat, tinnitus, trouble swallowing and voice change.   Eyes: Negative for photophobia and visual disturbance.  Respiratory: Negative for cough, chest tightness, shortness of breath, wheezing and stridor.   Cardiovascular: Negative for chest pain and leg swelling.  Gastrointestinal: Negative for abdominal distention, abdominal pain, blood in stool, constipation, diarrhea, nausea and vomiting.  Endocrine: Negative for cold intolerance and heat intolerance.  Genitourinary: Negative for difficulty urinating, dysuria and hematuria.  Musculoskeletal: Positive for myalgias and neck pain. Negative for arthralgias, back pain, gait problem, joint swelling and neck stiffness.  Skin: Positive for color change and rash. Negative for pallor and wound.  Allergic/Immunologic: Negative for environmental allergies and food allergies.  Neurological: Positive for weakness and numbness. Negative for dizziness, tremors, seizures, syncope, facial asymmetry, speech difficulty, light-headedness and headaches.  Hematological: Negative for adenopathy. Does not bruise/bleed easily.  Psychiatric/Behavioral: Positive for sleep disturbance. Negative for agitation and confusion. The patient is not nervous/anxious.        Objective:   Physical Exam  Constitutional: She is oriented to person, place, and time. Vital signs are normal. She appears well-developed and well-nourished. She is active and cooperative.  Non-toxic appearance. She does not have a sickly appearance. She does not appear ill. No distress.  HENT:  Head: Normocephalic and atraumatic.  Right Ear: Hearing and external ear normal.  Left Ear: Hearing and external ear normal.  Nose: Nose normal.  Mouth/Throat: Uvula is midline, oropharynx is clear and moist and mucous  membranes are normal. No oropharyngeal exudate. No tonsillar exudate.  Eyes: Pupils are equal, round, and reactive to light. Conjunctivae, EOM and lids are normal. Right eye  exhibits no discharge. Left eye exhibits no discharge. No scleral icterus.  Neck: Trachea normal, normal range of motion and phonation normal. Neck supple. No tracheal deviation present. No thyromegaly present.  Cardiovascular: Normal rate, regular rhythm, normal heart sounds and intact distal pulses.  Pulses:      Radial pulses are 2+ on the right side, and 2+ on the left side.  Pulmonary/Chest: Effort normal and breath sounds normal. No stridor. No respiratory distress. She has no decreased breath sounds. She has no wheezes. She has no rhonchi. She has no rales. She exhibits no tenderness.  Abdominal: Soft. Normal appearance. She exhibits no distension, no fluid wave and no ascites. There is no rigidity and no guarding.  Musculoskeletal: Normal range of motion. She exhibits tenderness. She exhibits no edema or deformity.       Right shoulder: Normal.       Left shoulder: Normal.       Right elbow: Normal.      Left elbow: Normal.       Right hip: Normal.       Left hip: Normal.       Right knee: Normal.       Left knee: Normal.       Cervical back: She exhibits pain. She exhibits normal range of motion, no tenderness, no bony tenderness, no swelling, no edema, no deformity, no laceration, no spasm and normal pulse.       Thoracic back: She exhibits pain. She exhibits normal range of motion, no tenderness, no bony tenderness, no swelling, no edema, no deformity, no laceration, no spasm and normal pulse.       Lumbar back: Normal.       Back:       Right upper arm: Normal.       Left upper arm: Normal.       Right forearm: Normal.       Left forearm: Normal.       Right hand: Normal.       Left hand: She exhibits normal range of motion, no tenderness, no bony tenderness, normal capillary refill, no deformity, no laceration and no swelling. Normal sensation noted. Normal strength noted. She exhibits no finger abduction, no thumb/finger opposition and no wrist extension trouble.  Cervical  traction 15 seconds did not improve pain cervical or arm symptoms in clinic; dorsum left hand with nonmobile 1cm nodule slightly tender  Lymphadenopathy:       Head (right side): No submental, no submandibular, no tonsillar, no preauricular, no posterior auricular and no occipital adenopathy present.       Head (left side): No submental, no submandibular, no tonsillar, no preauricular, no posterior auricular and no occipital adenopathy present.    She has no cervical adenopathy.       Right cervical: No superficial cervical, no deep cervical and no posterior cervical adenopathy present.      Left cervical: No superficial cervical, no deep cervical and no posterior cervical adenopathy present.  Neurological: She is alert and oriented to person, place, and time. She has normal strength. She is not disoriented. She displays no atrophy, no tremor and normal reflexes. No cranial nerve deficit or sensory deficit. She exhibits normal muscle tone. She displays no seizure activity. Coordination and gait normal. GCS eye subscore is 4. GCS verbal subscore is  5. GCS motor subscore is 6.  Reflex Scores:      Brachioradialis reflexes are 2+ on the right side and 2+ on the left side.      Patellar reflexes are 2+ on the right side and 2+ on the left side. On/off exam table; in/out of chair without difficulty; gait sure and steady in hallway; strength all extremities equal bilaterally 5/5  Skin: Skin is warm, dry and intact. Capillary refill takes less than 2 seconds. No rash noted. She is not diaphoretic. No cyanosis or erythema. No pallor. Nails show no clubbing.  Psychiatric: She has a normal mood and affect. Her speech is normal and behavior is normal. Judgment and thought content normal. She is not actively hallucinating. Cognition and memory are normal. She is attentive.  Nursing note and vitals reviewed.     CLINICAL DATA:  Bilateral upper extremity pain, numbness  EXAM: CERVICAL SPINE - COMPLETE 4+  VIEW  COMPARISON:  None.  FINDINGS: Degenerative facet disease diffusely throughout the cervical spine. Normal alignment. Disc space narrowing at C3-4 and C6-7. Anterior spurring. No neural foraminal narrowing or fracture.  IMPRESSION: Degenerative disc and facet disease as above. No acute bony abnormality.   Electronically Signed   By: Rolm Baptise M.D.   On: 09/13/2017 09:58    Assessment & Plan:  A-tick bite initial encounter, fatigue, rash, paresthesias  P-doxycycline 100mg  po BID x 14 days #40 RF0  20 tablets dispensed from [facility] to patient today.  She is to return in 5 days to pick up second bottle after we ensure she tolerated Doxcycline previously taken in 2017. Up to date recommends 14 days treatment if erythema migrans or neuro symptoms.   Discussed if another tick bite come to clinic same day/next day for prophylaxis treatment doxycycline 200mg  po x1.  Possible side effects gi n/v/d and sun sensitivity/sunburn wear protective clothing/sunscreen/hat while on medication.  Prefers treatment over testing.  Has been longer than 72 hours. Patient requesting labs and treatment therefore will start with 10 days doxycycline 100mg  po BID. Discussed labs ship out approximately 48 hours to get results. Patient to follow up with PCM or [place] if worsening symptoms or new symptoms for re-evaluation e.g. Fever, lethargy, hematemesis/hematuria/hemoptysis, wheezing, rash. exitcare handout on lyme disease, tick bite, [place] spotted fever and fatigue. Patient verbalized understanding of information/instructions, agreed with plan of care and had no further questions at this time.   Show nodule left hand to orthopedic provider next visit.  Probable ganglion cyst.  Discussed probably noncancerous cyst related to repetitive motion/irritation of tendon.  Sometimes will decrease in size without decreased activity and other times surgical removal required.  Keep neck MRI appt already scheduled for  neck/arm tingling/numbness.  Patient established care with [facility] and plain film showed decreased disc space/spurring/facet disease/possible pinched cervical nerve.  Consider cervical traction to see if symptoms improve and changing sleeping position with arms at sides prone to see if less arm/hand symptoms in am currently back/side sleeping.  Exitcare handout ganglion cyst, facet disease, pinched nerve, paresthesias, degenerative joint disease.  Patient verbalized understanding information/instructions, agreed with plan of care and had no further questions at this time.

## 2017-10-26 NOTE — Patient Instructions (Addendum)
Lyme Disease Lyme disease is an infection that affects many parts of the body, including the skin, joints, and nervous system. It is a bacterial infection that starts from the bite of an infected tick. The infection can spread, and some of the symptoms are similar to the flu. If Lyme disease is not treated, it may cause joint pain, swelling, numbness, problems thinking, fatigue, muscle weakness, and other problems. What are the causes? This condition is caused by bacteria called Borrelia burgdorferi. You can get Lyme disease by being bitten by an infected tick. The tick must be attached to your skin to pass along the infection. Deer often carry infected ticks. What increases the risk? The following factors may make you more likely to develop this condition:  Living in or visiting these areas in the [place]  [place].  [place].  [place].  Spending time in wooded or grassy areas.  Being outdoors with exposed skin.  Camping, gardening, hiking, fishing, or hunting outdoors.  Failing to remove a tick from your skin within 3-4 days. What are the signs or symptoms? Symptoms of this condition include:  A round, red rash that surrounds the center of the tick bite. This is the first sign of infection. The center of the rash may be blood colored or have tiny blisters.  Fatigue.  Headache.  Chills and fever.  General achiness.  Joint pain, often in the knees.  Muscle pain.  Swollen lymph glands.  Stiff neck. How is this diagnosed? This condition is diagnosed based on:  Your symptoms and medical history.  A physical exam.  A blood test. How is this treated? The main treatment for this condition is antibiotic medicine, which is usually taken by mouth (orally). The length of treatment depends on how soon after a tick bite you begin taking the medicine. In some cases, treatment is necessary for several weeks. If the infection is severe, antibiotics may  need to be given through an IV tube that is inserted into one of your veins. Follow these instructions at home:  Take your antibiotic medicine as told by your health care provider. Do not stop taking the antibiotic even if you start to feel better.  Ask your health care provider about takinga probiotic in between doses of your antibiotic to help avoid stomach upset or diarrhea.  Check with your health care provider before supplementing your treatment. Many alternative therapies have not been proven and may be harmful to you.  Keep all follow-up visits as told by your health care provider. This is important. How is this prevented? You can become reinfected if you get another tick bite from an infected tick. Take these steps to help prevent an infection:  Cover your skin with light-colored clothing when you are outdoors in the spring and summer months.  Spray clothing and skin with bug spray. The spray should be 20-30% DEET.  Avoid wooded, grassy, and shaded areas.  Remove yard litter, brush, trash, and plants that attract deer and rodents.  Check yourself for ticks when you come indoors.  Wash clothing worn each day.  Check your pets for ticks before they come inside.  If you find a tick:  Remove it with tweezers.  Clean your hands and the bite area with rubbing alcohol or soap and water. Pregnant women should take special care to avoid tick bites because the infection can be passed along to the fetus. Contact a health care provider if:  You have symptoms after   You have symptoms after treatment.  You have removed a tick and want to bring it to your health care provider for testing. Get help right away if:  You have an irregular heartbeat.  You have nerve pain.  Your face feels numb. This information is not intended to replace advice given to you by your health care provider. Make sure you discuss any questions you have with your health care provider. Document Released: 05/09/2000 Document  Revised: 09/22/2015 Document Reviewed: 09/22/2015 Elsevier Interactive Patient Education  2018 [place] Spotted Fever [facility] spotted fever is an illness that is spread to people by infected ticks. The illness causes flulike symptoms and a reddish-purple rash. This illness can quickly become very serious. Treatment must be started right away. When the illness is not treated right away, it can sometimes lead to long-term health problems or even death. This illness is most common during warm weather when ticks are most active. What are the causes? [facility] spotted fever is caused by a type of bacteria that is called Rickettsia rickettsii. This type of bacteria is carried by [place] dog ticks and [employer]. People get infected through a bite from a tick that is infected with the bacteria. The bite is painless, and it frequently goes unnoticed. The bacteria can also infect a person when tick blood or tick feces get into a person's body through damaged skin. A tick bite is not necessary for an infection to occur. People can get [facility] spotted fever if they get a tick's blood or body fluids on their skin in the area of a small cut or sore. This could happen while removing a tick from another person or a dog. The infection is not contagious, and it cannot be spread (transmitted) from person to person. What are the signs or symptoms? Symptoms may begin 2-14 days after a tick bite. The most common early symptoms are:  Fever.  Muscle aches.  Headache.  Nausea.  Vomiting.  Poor appetite.  Abdominal pain.  The reddish-purple rash usually appears 3-5 days after the first symptoms begin. The rash often starts on the wrists and ankles. It may then spread to the palms, the soles of the feet, the legs, and the trunk. How is this diagnosed? Diagnosis is based on a physical exam, medical history, and blood tests. Your health care provider may suspect  [facility] spotted fever in one of these cases:  If you have recently been bitten by a tick.  If you have been in areas that have a lot of ticks or in areas where the disease is common.  How is this treated? It is important to begin treatment right away. Treatment will usually involve the use of antibiotic medicines. In some cases, your health care provider may begin treatment before the diagnosis is confirmed. If your symptoms are severe, a hospital stay may be needed. Follow these instructions at home:  Rest as much as possible until you feel better.  Take medicines only as directed by your health care provider.  Take your antibiotic medicine as directed by your health care provider. Finish the antibiotic even if you start to feel better.  Drink enough fluid to keep your urine clear or pale yellow.  Keep all follow-up visits as directed by your health care provider. This is important. How is this prevented? Avoiding tick bites can help to prevent this illness. Take these steps to avoid tick bites when you are outdoors:  Be  aware that most ticks live in shrubs, low tree branches, and grassy areas. A tick can climb onto your body when you make contact with leaves or grass where the tick is waiting.  Wear protective clothing. Long sleeves and long pants are best.  Wear white clothes so you can see ticks more easily.  Tuck your pant legs into your socks.  If you go walking on a trail, stay in the middle of the trail to avoid brushing against bushes.  Avoid walking through areas that have long grass.  Put insect repellent on all exposed skin and along boot tops, pant legs, and sleeve cuffs.  Check clothing, hair, and skin repeatedly and before going inside.  Check family members and pets for ticks.  Brush off any ticks that are not attached.  Take a shower or a bath as soon as possible after you have been outdoors. Check your skin for ticks. The most common places on the  body where ticks attach themselves are the scalp, neck, armpits, waist, and groin.  You can also greatly reduce your chances of getting [facility] spotted fever if you remove attached ticks as soon as possible. To remove an attached tick, use a forceps or fine-point tweezers to detach the intact tick without leaving its mouth parts in the skin. The wound from the tick bite should be washed after the tick has been removed. Contact a health care provider if:  You have drainage, swelling, or increased redness or pain in the area of the rash. Get help right away if:  You have chest pain.  You have shortness of breath.  You have a severe headache.  You have a seizure.  You have severe abdominal pain.  You are feeling confused.  You are bruising easily.  You have bleeding from your gums.  You have blood in your stool. This information is not intended to replace advice given to you by your health care provider. Make sure you discuss any questions you have with your health care provider. Document Released: 05/15/2000 Document Revised: 07/09/2015 Document Reviewed: 09/16/2013 Elsevier Interactive Patient Education  2018 [place], [place] Ticks are insects that draw blood for food. Most ticks live in shrubs and grassy areas. They climb onto people and animals that brush against the leaves and grasses that they rest on. Then they bite, attaching themselves to the skin. Most ticks are harmless, but some ticks carry germs that can spread to a person through a bite and cause a disease. To reduce your risk of getting a disease from a tick bite, it is important to take steps to prevent tick bites. It is also important to check for ticks after being outdoors. If you find that a tick has attached to you, watch for symptoms of disease. How can I prevent tick bites? Take these steps to help prevent tick bites when you are outdoors in an area where ticks are found:  Use  insect repellent that has DEET (20% or higher), picaridin, or IR3535 in it. Use it on: ? Skin that is showing. ? The top of your boots. ? Your pant legs. ? Your sleeve cuffs.  For repellent products that contain permethrin, follow product instructions. Use these products on: ? Clothing. ? Gear. ? Boots. ? Tents.  Wear protective clothing. Long sleeves and long pants offer the best protection from ticks.  Wear light-colored clothing so you can see ticks more easily.  Tuck your pant legs into your socks.  If you go walking on a trail, stay in the middle of the trail so your skin, hair, and clothing do not touch the bushes.  Avoid walking through areas with long grass.  Check for ticks on your clothing, hair, and skin often while you are outside, and check again before you go inside. Make sure to check the places that ticks attach themselves most often. These places include the scalp, neck, armpits, waist, groin, and joint areas. Ticks that carry a disease called Lyme disease have to be attached to the skin for 24-48 hours. Checking for ticks every day will lessen your risk of this and other diseases.  When you come indoors, wash your clothes and take a shower or a bath right away. Dry your clothes in a dryer on high heat for at least 60 minutes. This will kill any ticks in your clothes.  What is the proper way to remove a tick? If you find a tick on your body, remove it as soon as possible. Removing a tick sooner rather than later can prevent germs from passing from the tick to your body. To remove a tick that is crawling on your skin but has not bitten:  Go outdoors and brush the tick off.  Remove the tick with tape or a lint roller.  To remove a tick that is attached to your skin:  Wash your hands.  If you have latex gloves, put them on.  Use tweezers, curved forceps, or a tick-removal tool to gently grasp the tick as close to your skin and the tick's head as  possible.  Gently pull with steady, upward pressure until the tick lets go. When removing the tick: ? Take care to keep the tick's head attached to its body. ? Do not twist or jerk the tick. This can make the tick's head or mouth break off. ? Do not squeeze or crush the tick's body. This could force disease-carrying fluids from the tick into your body.  Do not try to remove a tick with heat, alcohol, petroleum jelly, or fingernail polish. Using these methods can cause the tick to salivate and regurgitate into your bloodstream, increasing your risk of getting a disease. What should I do after removing a tick?  Clean the bite area with soap and water, rubbing alcohol, or an iodine scrub.  If an antiseptic cream or ointment is available, apply a small amount to the bite site.  Wash and disinfect any instruments that you used to remove the tick. How should I dispose of a tick? To dispose of a live tick, use one of these methods:  Place it in rubbing alcohol.  Place it in a sealed bag or container.  Wrap it tightly in tape.  Flush it down the toilet.  Contact a health care provider if:  You have symptoms of a disease after a tick bite. Symptoms of a tick-borne disease can occur from moments after the tick bites to up to 30 days after a tick is removed. Symptoms include: ? Muscle, joint, or bone pain. ? Difficulty walking or moving your legs. ? Numbness in the legs. ? Paralysis. ? Red rash around the tick bite area that is shaped like a target or a "bull's-eye." ? Redness and swelling in the area of the tick bite. ? Fever. ? Repeated vomiting. ? Diarrhea. ? Weight loss. ? Tender, swollen lymph glands. ? Shortness of breath. ? Cough. ? Pain in the abdomen. ? Headache. ? Abnormal tiredness. ? A change  in your level of consciousness. ? Confusion. Get help right away if:  You are not able to remove a tick.  A part of a tick breaks off and gets stuck in your skin.  Your  symptoms get worse. Summary  Ticks may carry germs that can spread to a person through a bite and cause disease.  Wear protective clothing and use insect repellent to prevent tick bites. Follow product instructions.  If you find a tick on your body, remove it as soon as possible. If the tick is attached, do not try to remove with heat, alcohol, petroleum jelly, or fingernail polish.  Remove the attached tick using tweezers, curved forceps, or a tick-removal tool. Gently pull with steady, upward pressure until the tick lets go. Do not twist or jerk the tick. Do not squeeze or crush the tick's body.  If you have symptoms after being bitten by a tick, contact a health care provider. This information is not intended to replace advice given to you by your health care provider. Make sure you discuss any questions you have with your health care provider. Document Released: 01/29/2000 Document Revised: 11/13/2015 Document Reviewed: 11/13/2015 Elsevier Interactive Patient Education  2018 [employer].  Ganglion Cyst A ganglion cyst is a noncancerous, fluid-filled lump that occurs near joints or tendons. The ganglion cyst grows out of a joint or the lining of a tendon. It most often develops in the hand or wrist, but it can also develop in the shoulder, elbow, hip, knee, ankle, or foot. The round or oval ganglion cyst can be the size of a pea or larger than a grape. Increased activity may enlarge the size of the cyst because more fluid starts to build up. What are the causes? It is not known what causes a ganglion cyst to grow. However, it may be related to:  Inflammation or irritation around the joint.  An injury.  Repetitive movements or overuse.  Arthritis.  What increases the risk? Risk factors include:  Being a woman.  Being age 10-50.  What are the signs or symptoms? Symptoms may include:  A lump. This most often appears on the hand or wrist, but it can occur in other areas of the  body.  Tingling.  Pain.  Numbness.  Muscle weakness.  Weak grip.  Less movement in a joint.  How is this diagnosed? Ganglion cysts are most often diagnosed based on a physical exam. Your health care provider will feel the lump and may shine a light alongside it. If it is a ganglion cyst, a light often shines through it. Your health care provider may order an X-ray, ultrasound, or MRI to rule out other conditions. How is this treated? Ganglion cysts usually go away on their own without treatment. If pain or other symptoms are involved, treatment may be needed. Treatment is also needed if the ganglion cyst limits your movement or if it gets infected. Treatment may include:  Wearing a brace or splint on your wrist or finger.  Taking anti-inflammatory medicine.  Draining fluid from the lump with a needle (aspiration).  Injecting a steroid into the joint.  Surgery to remove the ganglion cyst.  Follow these instructions at home:  Do not press on the ganglion cyst, poke it with a needle, or hit it.  Take medicines only as directed by your health care provider.  Wear your brace or splint as directed by your health care provider.  Watch your ganglion cyst for any changes.  Keep all  follow-up visits as directed by your health care provider. This is important. Contact a health care provider if:  Your ganglion cyst becomes larger or more painful.  You have increased redness, red streaks, or swelling.  You have pus coming from the lump.  You have weakness or numbness in the affected area.  You have a fever or chills. This information is not intended to replace advice given to you by your health care provider. Make sure you discuss any questions you have with your health care provider. Document Released: 01/29/2000 Document Revised: 07/09/2015 Document Reviewed: 07/16/2013 Elsevier Interactive Patient Education  2018 [place] Nerve A pinched nerve is a type of  injury that occurs when too much pressure is placed on a nerve. This pressure can cause pain, burning, and muscle weakness in places such as your arm, hand, back, leg, or neck. A nerve can become permanently damaged if it is severely pinched or if it has been pinched for a long time. What are the causes? This condition may be caused by:  The passing of a nerve through a narrow area between bones or other body structures.  Loss of blood supply to a nerve.  A nerve being stretched from an injury.  A sudden injury with swelling.  Wear and tear that occurs over several years.  Changes that occur in the spine with age.  What are the signs or symptoms? The most common symptom of a pinched nerve is a tingling feeling or numbness. Other symptoms include:  Pain that radiates from the affected nerve to the body part that the nerve supplies.  A burning feeling.  Muscle weakness in the muscles supplied by the injured nerve.  How is this diagnosed? This condition is diagnosed with a physical exam. During the exam, a health care provider will check for numbness and muscle weakness and move the affected body parts to test for pain. You may also have other tests, such as:  X-rays to check for bone damage.  An MRI or CT scan to check for nerve damage.  Electromyography (EMG) to check for electrical signals passing through nerves to muscles.  How is this treated? The first treatment for a pinched nerve is usually rest and supportive devices, such as a splint, brace, or neck collar. Additional treatment depends on symptoms and the amount of nerve damage. This can include:  Medicines, such as: ? Numbing medicine injections. ? Nonsteroidal anti-inflammatory drugs (NSAIDs). ? Steroid medicines in pill form or by injection.  Physical therapy to relieve pain, maintain movement, and improve muscle strength.  Surgery. This may be done if other treatments do not work.  Follow these instructions at  home:  Only take medicines as directed by your health care provider.  Wear supportive or protective devices as directed by your health care provider.  Do stretching and strengthening exercises at home as directed by your health care provider.  Rest as needed.  Keep all follow-up visits as directed by your health care provider. This is important. Contact a health care provider if:  Your condition does not improve with treatment.  Your pain, numbness, or weakness suddenly gets worse. This information is not intended to replace advice given to you by your health care provider. Make sure you discuss any questions you have with your health care provider. Document Released: 01/21/2002 Document Revised: 07/09/2015 Document Reviewed: 11/06/2013 Elsevier Interactive Patient Education  2018 [employer].  Facet Syndrome Facet syndrome is a condition in which joints (facet joints)  that connect the bones of the spine (vertebrae) become damaged. Facet joints help the spine move, and they usually wear down (degenerate) or become inflamed as you age. This can cause pain and stiffness in the neck (cervical facet syndrome) or in the lower back (lumbar facet syndrome). When a facet joint becomes damaged, a vertebra may slip forward, out of its normal place in the spine. Damage to a facet joint can also damage nerves near the spine, which can cause tingling or weakness in the arms or legs. Facet syndrome can make it difficult to turn the head or bend backward without pain. This condition typically gets worse over time. What are the causes? Common causes of this condition include:  Age-related inflammation of the facet joints (arthritis) that may create extra bone on the joint surface (bone spurs).  Age-related decrease in space between the vertebrae (disk degeneration and cartilage degeneration).  Repetitive stress on the spine, such as repetitive twisting of the back.  Injury (trauma) to the back or  neck.  What increases the risk? The following factors may make you more likely to develop this condition:  Playing contact sports.  Doing activities or sports that involve repetitive twisting motions or repetitive heavy lifting.  Having poor back strength and flexibility.  Having another back or spine condition, such as scoliosis.  What are the signs or symptoms? Symptoms of facet syndrome may include:  An ache in the neck or lower back. This may get worse when you twist or arch your back, or when you look up.  Stiffness in the neck or lower back.  Numbness, tingling, or weakness in the arms or legs.  Symptoms of cervical facet syndrome may include:  Headache.  Pain at the back of the head.  Pain in the shoulder blades.  Symptoms of lumbar facet syndrome may include pain in any of the following areas:  Groin.  Thighs.  Lower back.  Buttocks.  Hips.  How is this diagnosed? This condition may be diagnosed based on:  Your symptoms.  Your medical history.  A physical exam.  Imaging tests, such as: ? X-rays. ? MRI.  A procedure in which medicines to numb the area (local anesthetic) and medicines to reduce inflammation (steroids) are injected into your affected joint (facet joint block).  How is this treated? Treatment for this condition may include:  Stopping or modifying activities that make your condition worse.  Medicines that help reduce pain and inflammation.  Steroid injections to help reduce severe pain.  Physical therapy.  Radiofrequency ablation. This is a surgical procedure that uses high-frequency radio waves to block signals from affected nerves.  Surgery to stabilize your spine or to take pressure off your nerves. This is rare.  Follow these instructions at home: Activity  Rest your neck and back as told by your health care provider.  Return to your normal activities as told by your health care provider. Ask your health care  provider what activities are safe for you.  If physical therapy was prescribed, do exercises as told by your health care provider. General instructions  Take over-the-counter and prescription medicines only as told by your health care provider.  Do not drive or operate heavy machinery while taking prescription pain medicines.  Do not use any tobacco products, such as cigarettes, chewing tobacco, and e-cigarettes. Tobacco can delay bone healing. If you need help quitting, ask your health care provider.  Use good posture throughout your daily activities. Good posture means that your spine  is in its natural S-curve position (your spine is neutral), your shoulders are pulled back slightly, and your head is not forward.  Keep all follow-up visits as told by your health care provider. This is important. Contact a health care provider if:  You have symptoms that get worse or do not improve in 2-4 weeks of treatment.  You have numbness or weakness in any part of your body.  You lose control over your bladder or bowel function. This information is not intended to replace advice given to you by your health care provider. Make sure you discuss any questions you have with your health care provider. Document Released: 01/31/2005 Document Revised: 03/04/2015 Document Reviewed: 10/13/2014 Elsevier Interactive Patient Education  2018 [employer]. Paresthesia Paresthesia is an abnormal burning or prickling sensation. This sensation is generally felt in the hands, arms, legs, or feet. However, it may occur in any part of the body. Usually, it is not painful. The feeling may be described as:  Tingling or numbness.  Pins and needles.  Skin crawling.  Buzzing.  Limbs falling asleep.  Itching.  Most people experience temporary (transient) paresthesia at some time in their lives. Paresthesia may occur when you breathe too quickly (hyperventilation). It can also occur without any apparent cause.  Commonly, paresthesia occurs when pressure is placed on a nerve. The sensation quickly goes away after the pressure is removed. For some people, however, paresthesia is a long-lasting (chronic) condition that is caused by an underlying disorder. If you continue to have paresthesia, you may need further medical evaluation. Follow these instructions at home: Watch your condition for any changes. Taking the following actions may help to lessen any discomfort that you are feeling:  Avoid drinking alcohol.  Try acupuncture or massage to help relieve your symptoms.  Keep all follow-up visits as directed by your health care provider. This is important.  Contact a health care provider if:  You continue to have episodes of paresthesia.  Your burning or prickling feeling gets worse when you walk.  You have pain, cramps, or dizziness.  You develop a rash. Get help right away if:  You feel weak.  You have trouble walking or moving.  You have problems with speech, understanding, or vision.  You feel confused.  You cannot control your bladder or bowel movements.  You have numbness after an injury.  You faint. This information is not intended to replace advice given to you by your health care provider. Make sure you discuss any questions you have with your health care provider. Document Released: 01/21/2002 Document Revised: 07/09/2015 Document Reviewed: 01/27/2014 Elsevier Interactive Patient Education  2018 [employer].  Osteoarthritis Osteoarthritis is a type of arthritis that affects tissue that covers the ends of bones in joints (cartilage). Cartilage acts as a cushion between the bones and helps them move smoothly. Osteoarthritis results when cartilage in the joints gets worn down. Osteoarthritis is sometimes called "wear and tear" arthritis. Osteoarthritis is the most common form of arthritis. It often occurs in older people. It is a condition that gets worse over time (a  progressive condition). Joints that are most often affected by this condition are in:  Fingers.  Toes.  Hips.  Knees.  Spine, including neck and lower back.  What are the causes? This condition is caused by age-related wearing down of cartilage that covers the ends of bones. What increases the risk? The following factors may make you more likely to develop this condition:  Older age.  Being  overweight or obese.  Overuse of joints, such as in athletes.  Past injury of a joint.  Past surgery on a joint.  Family history of osteoarthritis.  What are the signs or symptoms? The main symptoms of this condition are pain, swelling, and stiffness in the joint. The joint may lose its shape over time. Small pieces of bone or cartilage may break off and float inside of the joint, which may cause more pain and damage to the joint. Small deposits of bone (osteophytes) may grow on the edges of the joint. Other symptoms may include:  A grating or scraping feeling inside the joint when you move it.  Popping or creaking sounds when you move.  Symptoms may affect one or more joints. Osteoarthritis in a major joint, such as your knee or hip, can make it painful to walk or exercise. If you have osteoarthritis in your hands, you might not be able to grip items, twist your hand, or control small movements of your hands and fingers (fine motor skills). How is this diagnosed? This condition may be diagnosed based on:  Your medical history.  A physical exam.  Your symptoms.  X-rays of the affected joint(s).  Blood tests to rule out other types of arthritis.  How is this treated? There is no cure for this condition, but treatment can help to control pain and improve joint function. Treatment plans may include:  A prescribed exercise program that allows for rest and joint relief. You may work with a physical therapist.  A weight control plan.  Pain relief techniques, such as: ? Applying  heat and cold to the joint. ? Electric pulses delivered to nerve endings under the skin (transcutaneous electrical nerve stimulation, or TENS). ? Massage. ? Certain nutritional supplements.  NSAIDs or prescription medicines to help relieve pain.  Medicine to help relieve pain and inflammation (corticosteroids). This can be given by mouth (orally) or as an injection.  Assistive devices, such as a brace, wrap, splint, specialized glove, or cane.  Surgery, such as: ? An osteotomy. This is done to reposition the bones and relieve pain or to remove loose pieces of bone and cartilage. ? Joint replacement surgery. You may need this surgery if you have very bad (advanced) osteoarthritis.  Follow these instructions at home: Activity  Rest your affected joints as directed by your health care provider.  Do not drive or use heavy machinery while taking prescription pain medicine.  Exercise as directed. Your health care provider or physical therapist may recommend specific types of exercise, such as: ? Strengthening exercises. These are done to strengthen the muscles that support joints that are affected by arthritis. They can be performed with weights or with exercise bands to add resistance. ? Aerobic activities. These are exercises, such as brisk walking or water aerobics, that get your heart pumping. ? Range-of-motion activities. These keep your joints easy to move. ? Balance and agility exercises. Managing pain, stiffness, and swelling  If directed, apply heat to the affected area as often as told by your health care provider. Use the heat source that your health care provider recommends, such as a moist heat pack or a heating pad. ? If you have a removable assistive device, remove it as told by your health care provider. ? Place a towel between your skin and the heat source. If your health care provider tells you to keep the assistive device on while you apply heat, place a towel between the  assistive device and the  heat source. ? Leave the heat on for 20-30 minutes. ? Remove the heat if your skin turns bright red. This is especially important if you are unable to feel pain, heat, or cold. You may have a greater risk of getting burned.  If directed, put ice on the affected joint: ? If you have a removable assistive device, remove it as told by your health care provider. ? Put ice in a plastic bag. ? Place a towel between your skin and the bag. If your health care provider tells you to keep the assistive device on during icing, place a towel between the assistive device and the bag. ? Leave the ice on for 20 minutes, 2-3 times a day. General instructions  Take over-the-counter and prescription medicines only as told by your health care provider.  Maintain a healthy weight. Follow instructions from your health care provider for weight control. These may include dietary restrictions.  Do not use any products that contain nicotine or tobacco, such as cigarettes and e-cigarettes. These can delay bone healing. If you need help quitting, ask your health care provider.  Use assistive devices as directed by your health care provider.  Keep all follow-up visits as told by your health care provider. This is important. Where to find more information:  [employer] of Arthritis and Musculoskeletal and Skin Diseases: www.niams.SouthExposed.es  [employer] on Aging: http://kim-miller.com/  American College of Rheumatology: www.rheumatology.org Contact a health care provider if:  Your skin turns red.  You develop a rash.  You have pain that gets worse.  You have a fever along with joint or muscle aches. Get help right away if:  You lose a lot of weight.  You suddenly lose your appetite.  You have night sweats. Summary  Osteoarthritis is a type of arthritis that affects tissue covering the ends of bones in joints (cartilage).  This condition is caused by age-related wearing  down of cartilage that covers the ends of bones.  The main symptom of this condition is pain, swelling, and stiffness in the joint.  There is no cure for this condition, but treatment can help to control pain and improve joint function. This information is not intended to replace advice given to you by your health care provider. Make sure you discuss any questions you have with your health care provider. Document Released: 01/31/2005 Document Revised: 10/05/2015 Document Reviewed: 10/05/2015 Elsevier Interactive Patient Education  2018 [employer].   Fatigue Fatigue is feeling tired all of the time, a lack of energy, or a lack of motivation. Occasional or mild fatigue is often a normal response to activity or life in general. However, long-lasting (chronic) or extreme fatigue may indicate an underlying medical condition. Follow these instructions at home: Watch your fatigue for any changes. The following actions may help to lessen any discomfort you are feeling:  Talk to your health care provider about how much sleep you need each night. Try to get the required amount every night.  Take medicines only as directed by your health care provider.  Eat a healthy and nutritious diet. Ask your health care provider if you need help changing your diet.  Drink enough fluid to keep your urine clear or pale yellow.  Practice ways of relaxing, such as yoga, meditation, massage therapy, or acupuncture.  Exercise regularly.  Change situations that cause you stress. Try to keep your work and personal routine reasonable.  Do not abuse illegal drugs.  Limit alcohol intake to no more than 1  drink per day for nonpregnant women and 2 drinks per day for men. One drink equals 12 ounces of beer, 5 ounces of wine, or 1 ounces of hard liquor.  Take a multivitamin, if directed by your health care provider.  Contact a health care provider if:  Your fatigue does not get better.  You have a fever.  You  have unintentional weight loss or gain.  You have headaches.  You have difficulty: ? Falling asleep. ? Sleeping throughout the night.  You feel angry, guilty, anxious, or sad.  You are unable to have a bowel movement (constipation).  You skin is dry.  Your legs or another part of your body is swollen. Get help right away if:  You feel confused.  Your vision is blurry.  You feel faint or pass out.  You have a severe headache.  You have severe abdominal, pelvic, or back pain.  You have chest pain, shortness of breath, or an irregular or fast heartbeat.  You are unable to urinate or you urinate less than normal.  You develop abnormal bleeding, such as bleeding from the rectum, vagina, nose, lungs, or nipples.  You vomit blood.  You have thoughts about harming yourself or committing suicide.  You are worried that you might harm someone else. This information is not intended to replace advice given to you by your health care provider. Make sure you discuss any questions you have with your health care provider. Document Released: 11/28/2006 Document Revised: 07/09/2015 Document Reviewed: 06/04/2013 Elsevier Interactive Patient Education  [employer].

## 2017-11-07 ENCOUNTER — Encounter: Payer: Self-pay | Admitting: Registered Nurse

## 2017-11-07 ENCOUNTER — Telehealth: Payer: Self-pay | Admitting: Registered Nurse

## 2017-11-07 NOTE — Telephone Encounter (Signed)
Requested the last 4 days of doxycycline 100mg  po BID finished first 10 days denied side effects/intolerance/sunburn.  Dispensed from PDRx 20 capsules to patient today continue for 4 days to complete treatment for tick bite.  Patient reported MRI completed for neck pain/paresthesias.  Still having them awaiting results from ordering provider.  Patient verbalized understanding information/instructions, agreed with plan of care and had no further questions at this time.

## 2017-11-17 ENCOUNTER — Ambulatory Visit: Payer: Self-pay

## 2017-11-28 ENCOUNTER — Ambulatory Visit: Payer: PRIVATE HEALTH INSURANCE | Admitting: Neurology

## 2017-11-28 ENCOUNTER — Encounter

## 2018-03-27 ENCOUNTER — Ambulatory Visit: Payer: No Typology Code available for payment source | Admitting: Primary Care

## 2018-03-27 ENCOUNTER — Encounter: Payer: Self-pay | Admitting: Primary Care

## 2018-03-27 VITALS — BP 140/82 | HR 76 | Temp 99.1°F | Ht 63.0 in | Wt 175.0 lb

## 2018-03-27 DIAGNOSIS — J069 Acute upper respiratory infection, unspecified: Secondary | ICD-10-CM

## 2018-03-27 MED ORDER — HYDROCOD POLST-CPM POLST ER 10-8 MG/5ML PO SUER: 5.0000 mL | Freq: Two times a day (BID) | ORAL | 0 refills | Status: DC | PRN

## 2018-03-27 NOTE — Patient Instructions (Signed)
Your symptoms are representative of a viral illness which will resolve on its own over time. Our goal is to treat your symptoms in order to aid your body in the healing process and to make you more comfortable.   You may take the cough suppressant every 12 hours as needed for cough and rest. Caution this medication contains codeine which may cause drowsiness.   Start an antihistamine as discussed, Claritin or Allegra is fine.  Please notify me Monday next week if you develop persistent fevers of 101 and/or feel worse.  Increase consumption of water intake and rest.  It was a pleasure to see you today!

## 2018-03-27 NOTE — Progress Notes (Signed)
Subjective:    Patient ID: Valerie Sparks, female    DOB: 01-25-1956, 64 y.o.   MRN: [id number]  HPI  Valerie Sparks is a 63 year old female who presents today with a chief complaint of cough.   She also reports headache, nasal congestion, chills, scratchy throat, post nasal drip. Her symptoms began four days ago and have progressed since then. She's not been checking her temperature at home, thinks she was running a low grade fever several days ago. She's been taking Dayquil, Delsym, Flonase, saline nasal spray without much improvement. She denies sick contacts, sore throat.   Review of Systems  Constitutional: Negative for fever.  HENT: Positive for congestion, postnasal drip, rhinorrhea and sneezing. Negative for sore throat.   Respiratory: Positive for cough. Negative for shortness of breath.        Past Medical History:  Diagnosis Date  . Allergy   . Frequent headaches   . Hyperlipidemia      Social History   Socioeconomic History  . Marital status: Married    Spouse name: Not on file  . Number of children: Not on file  . Years of education: Not on file  . Highest education level: Not on file  Occupational History  . Not on file  Social Needs  . Financial resource strain: Not on file  . Food insecurity:    Worry: Not on file    Inability: Not on file  . Transportation needs:    Medical: Not on file    Non-medical: Not on file  Tobacco Use  . Smoking status: Never Smoker  . Smokeless tobacco: Never Used  Substance and Sexual Activity  . Alcohol use: Yes    Comment: very rarely  . Drug use: No  . Sexual activity: Yes  Lifestyle  . Physical activity:    Days per week: Not on file    Minutes per session: Not on file  . Stress: Not on file  Relationships  . Social connections:    Talks on phone: Not on file    Gets together: Not on file    Attends religious service: Not on file    Active member of club or organization: Not on file    Attends meetings of  clubs or organizations: Not on file    Relationship status: Not on file  . Intimate partner violence:    Fear of current or ex partner: Not on file    Emotionally abused: Not on file    Physically abused: Not on file    Forced sexual activity: Not on file  Other Topics Concern  . Not on file  Social History Narrative  . Not on file    Past Surgical History:  Procedure Laterality Date  . ABLATION    . BUNIONECTOMY     right  . TOOTH EXTRACTION    . TUBAL LIGATION      Family History  Problem Relation Age of Onset  . Hypertension Mother   . Diabetes Mother   . Heart disease Father   . Diabetes Sister   . Diabetes Brother   . Breast cancer Paternal Aunt 2  . Cancer Maternal Grandmother   . Kidney disease Paternal Grandfather   . Colon cancer Neg Hx   . Esophageal cancer Neg Hx   . Rectal cancer Neg Hx   . Stomach cancer Neg Hx     Allergies  Allergen Reactions  . Actifed Cold-Allergy [Chlorpheniramine-Phenyleph Er]   . Pseudoephedrine  Other (See Comments)    "takes me out of my skin"    Current Outpatient Medications on File Prior to Visit  Medication Sig Dispense Refill  . cholecalciferol (VITAMIN D) 1000 units tablet Take 1,000 Units by mouth daily.    [name] ibuprofen (ADVIL,MOTRIN) 800 MG tablet ibuprofen 800 mg tablet  Take 1 tablet every 8 hours by oral route.  Start after finishing Ketorolac    . ketorolac (TORADOL) 10 MG tablet ketorolac 10 mg tablet  Take 1 tablet every 8 hours by oral route.    [name] ketorolac (TORADOL) 30 MG/ML injection ketorolac 30 mg/mL (1 mL) injection solution  Inject 1 mL every 6 hours by intramuscular route.    . simvastatin (ZOCOR) 20 MG tablet Take 1 tablet by mouth every evening for cholesterol. 90 tablet 3  . triamcinolone ointment (KENALOG) 0.5 % Apply 1 application topically 2 (two) times daily. 30 g 0  . sodium chloride (OCEAN) 0.65 % SOLN nasal spray Place 2 sprays into both nostrils every 2 (two) hours while awake.  0   No  current facility-administered medications on file prior to visit.     BP 140/82   Pulse 76   Temp 99.1 F (37.3 C) (Oral)   Ht 5\' 3"  (1.6 m)   Wt 175 lb (79.4 kg)   SpO2 98%   BMI 31.00 kg/m    Objective:   Physical Exam  Constitutional: She appears well-nourished. She does not appear ill.  HENT:  Right Ear: Tympanic membrane and ear canal normal.  Left Ear: Tympanic membrane and ear canal normal.  Nose: Mucosal edema present. Right sinus exhibits no maxillary sinus tenderness and no frontal sinus tenderness. Left sinus exhibits no maxillary sinus tenderness and no frontal sinus tenderness.  Mouth/Throat: Oropharynx is clear and moist.  Neck: Neck supple.  Cardiovascular: Normal rate and regular rhythm.  Respiratory: Effort normal and breath sounds normal. She has no wheezes.  Dry cough during exam  Skin: Skin is warm and dry.           Assessment & Plan:

## 2018-03-27 NOTE — Assessment & Plan Note (Signed)
Symptoms x 4 days and progressing. Exam today with clear lungs, dry cough, doesn't appear sickly. Do suspect viral involvement at this point given overall unremarkable exam, will treat with supportive measures. Discussed use of antihistamine, Flonase PRN. Rx for Tussionex sent to pharmacy, drowsiness precautions provided. Fluids, rest, return precautions provided. If symptoms persist, would recommend Amoxil course.

## 2018-04-04 ENCOUNTER — Other Ambulatory Visit: Payer: Self-pay | Admitting: Family Medicine

## 2018-04-04 ENCOUNTER — Encounter: Payer: Self-pay | Admitting: Family Medicine

## 2018-04-04 ENCOUNTER — Ambulatory Visit: Payer: No Typology Code available for payment source | Admitting: Family Medicine

## 2018-04-04 VITALS — BP 128/78 | HR 73 | Temp 98.2°F | Ht 63.0 in | Wt 175.5 lb

## 2018-04-04 DIAGNOSIS — J069 Acute upper respiratory infection, unspecified: Secondary | ICD-10-CM

## 2018-04-04 DIAGNOSIS — R062 Wheezing: Secondary | ICD-10-CM | POA: Diagnosis not present

## 2018-04-04 MED ORDER — PREDNISONE 10 MG PO TABS: ORAL_TABLET | ORAL | 0 refills | Status: DC

## 2018-04-04 MED ORDER — HYDROCOD POLST-CPM POLST ER 10-8 MG/5ML PO SUER: 5.0000 mL | Freq: Two times a day (BID) | ORAL | 0 refills | Status: DC | PRN

## 2018-04-04 NOTE — Progress Notes (Signed)
Subjective:    Patient ID: Valerie Sparks, female    DOB: 1955-04-29, 63 y.o.   MRN: [id number]  HPI This is a 63 yo female who presents today with dry cough and runny nose x 2 weeks. Cough eased up for a couple of days. Pain in left lower back. Cough feels like in upper chest with some wheeze. Nasal drainage, post nasal drainage. Nasal drainage yellow and clear. Headache over eyes. Does not feel that she is improving, continues to have large amount nasal drainage.  Was seen 8 days ago by her pcp and diagnosed with viral illness. She was given tussionex and has been taking Allegra, guaifenesin DM, flonase and saline solution. Tussionex with some relief and good sleep.   Past Medical History:  Diagnosis Date  . Allergy   . Frequent headaches   . Hyperlipidemia    Past Surgical History:  Procedure Laterality Date  . ABLATION    . BUNIONECTOMY     right  . TOOTH EXTRACTION    . TUBAL LIGATION     Family History  Problem Relation Age of Onset  . Hypertension Mother   . Diabetes Mother   . Heart disease Father   . Diabetes Sister   . Diabetes Brother   . Breast cancer Paternal Aunt 64  . Cancer Maternal Grandmother   . Kidney disease Paternal Grandfather   . Colon cancer Neg Hx   . Esophageal cancer Neg Hx   . Rectal cancer Neg Hx   . Stomach cancer Neg Hx    Social History   Tobacco Use  . Smoking status: Never Smoker  . Smokeless tobacco: Never Used  Substance Use Topics  . Alcohol use: Yes    Comment: very rarely  . Drug use: No     Review of Systems Per HPI    Objective:   Physical Exam Vitals signs reviewed.  Constitutional:      General: She is not in acute distress.    Appearance: She is obese. She is ill-appearing. She is not toxic-appearing or diaphoretic.  HENT:     Head: Normocephalic and atraumatic.     Right Ear: Tympanic membrane, ear canal and external ear normal.     Left Ear: Tympanic membrane, ear canal and external ear normal.     Nose:  Congestion and rhinorrhea present.     Mouth/Throat:     Mouth: Mucous membranes are moist.     Pharynx: Oropharynx is clear.     Comments: Post nasal drainage.  Eyes:     Conjunctiva/sclera: Conjunctivae normal.  Neck:     Musculoskeletal: Normal range of motion and neck supple. No neck rigidity or muscular tenderness.  Cardiovascular:     Rate and Rhythm: Normal rate and regular rhythm.     Heart sounds: Normal heart sounds.  Pulmonary:     Effort: Pulmonary effort is normal.     Breath sounds: Normal breath sounds.  Chest:     Chest wall: Tenderness (left lateral ribs, no skin abnormality) present.  Lymphadenopathy:     Cervical: No cervical adenopathy.  Skin:    General: Skin is warm and dry.  Neurological:     Mental Status: She is alert and oriented to person, place, and time.  Psychiatric:        Mood and Affect: Mood normal.        Behavior: Behavior normal.        Thought Content: Thought content normal.  Judgment: Judgment normal.       BP 128/78 (BP Location: Left Arm, Patient Position: Sitting, Cuff Size: Normal)   Pulse 73   Temp 98.2 F (36.8 C) (Oral)   Ht 5\' 3"  (1.6 m)   Wt 175 lb 8 oz (79.6 kg)   SpO2 98%   BMI 31.09 kg/m  Wt Readings from Last 3 Encounters:  04/04/18 175 lb 8 oz (79.6 kg)  03/27/18 175 lb (79.4 kg)  09/13/17 174 lb 12 oz (79.3 kg)       Assessment & Plan:  1. Acute upper respiratory infection - continues to have nasal congestion despite antihistamine, fluticasone, saline nasal spray, will give her course of prednisone. Reviewed potential side effects - RTC precautions reviwed - chlorpheniramine-HYDROcodone (TUSSIONEX PENNKINETIC ER) 10-8 MG/5ML SUER; Take 5 mLs by mouth every 12 (twelve) hours as needed for cough.  Dispense: 115 mL; Refill: 0 - predniSONE (DELTASONE) 10 MG tablet; Take 6 x 1 day, 5x1,4x1,3x1,2x1,1x1  Dispense: 21 tablet; Refill: 0  2. Wheeze - predniSONE (DELTASONE) 10 MG tablet; Take 6 x 1 day,  5x1,4x1,3x1,2x1,1x1  Dispense: 21 tablet; Refill: 0   Clarene Reamer, FNP-BC  [facility] [place]  04/04/2018 8:26 PM

## 2018-04-04 NOTE — Patient Instructions (Signed)
Good to see you today Continue flonse, saline nasal spray, Allegra, tylenol I have sent more cough syrup to your pharmacy as well as prednisone. Take prednisone with full glass of water and food. Brace ribs with pillow when coughing.  Drink enough fluids to keep your urine light yellow If not better in 4-7 days or if worse, please let us know  Happy Rudene Anda!

## 2018-05-04 ENCOUNTER — Other Ambulatory Visit: Payer: Self-pay

## 2018-05-04 ENCOUNTER — Ambulatory Visit: Payer: Self-pay | Admitting: *Deleted

## 2018-05-04 VITALS — BP 117/76 | HR 70 | Ht 63.5 in | Wt 171.0 lb

## 2018-05-04 DIAGNOSIS — Z0000 Encounter for general adult medical examination without abnormal findings: Secondary | ICD-10-CM

## 2018-05-04 NOTE — Progress Notes (Signed)
[employer] insurance premium discount evaluation: Labs Drawn. Replacements ROI form signed. Tobacco Free Attestation form signed.  Forms placed in paper chart. Okay to route results to pcp per pt. 

## 2018-05-05 LAB — CMP12+LP+TP+TSH+6AC+CBC/D/PLT
ALK PHOS: 61 IU/L (ref 39–117)
ALT: 14 IU/L (ref 0–32)
AST: 22 IU/L (ref 0–40)
Albumin/Globulin Ratio: 2 (ref 1.2–2.2)
Albumin: 4.3 g/dL (ref 3.8–4.8)
BASOS ABS: 0 10*3/uL (ref 0.0–0.2)
BASOS: 1 %
BILIRUBIN TOTAL: 0.5 mg/dL (ref 0.0–1.2)
BUN/Creatinine Ratio: 19 (ref 12–28)
BUN: 15 mg/dL (ref 8–27)
CHOLESTEROL TOTAL: 172 mg/dL (ref 100–199)
Calcium: 9.8 mg/dL (ref 8.7–10.3)
Chloride: 103 mmol/L (ref 96–106)
Chol/HDL Ratio: 3.7 ratio (ref 0.0–4.4)
Creatinine, Ser: 0.79 mg/dL (ref 0.57–1.00)
EOS (ABSOLUTE): 0.1 10*3/uL (ref 0.0–0.4)
Eos: 2 %
Estimated CHD Risk: 0.7 times avg. (ref 0.0–1.0)
Free Thyroxine Index: 1.6 (ref 1.2–4.9)
GFR calc Af Amer: 93 mL/min/{1.73_m2} (ref >59)
GFR calc non Af Amer: 80 mL/min/{1.73_m2} (ref >59)
GGT: 12 IU/L (ref 0–60)
GLUCOSE: 111 mg/dL — AB (ref 65–99)
Globulin, Total: 2.1 g/dL (ref 1.5–4.5)
HDL: 46 mg/dL (ref >39)
HEMATOCRIT: 41.3 % (ref 34.0–46.6)
Hemoglobin: 13.8 g/dL (ref 11.1–15.9)
IMMATURE GRANS (ABS): 0 10*3/uL (ref 0.0–0.1)
IMMATURE GRANULOCYTES: 0 %
Iron: 102 ug/dL (ref 27–139)
LDH: 222 IU/L (ref 119–226)
LDL Calculated: 98 mg/dL (ref 0–99)
LYMPHS: 36 %
Lymphocytes Absolute: 1.7 10*3/uL (ref 0.7–3.1)
MCH: 27.8 pg (ref 26.6–33.0)
MCHC: 33.4 g/dL (ref 31.5–35.7)
MCV: 83 fL (ref 79–97)
MONOS ABS: 0.6 10*3/uL (ref 0.1–0.9)
Monocytes: 11 %
Neutrophils Absolute: 2.4 10*3/uL (ref 1.4–7.0)
Neutrophils: 50 %
PLATELETS: 305 10*3/uL (ref 150–450)
Phosphorus: 3.4 mg/dL (ref 3.0–4.3)
Potassium: 4.1 mmol/L (ref 3.5–5.2)
RBC: 4.96 x10E6/uL (ref 3.77–5.28)
RDW: 13.2 % (ref 11.7–15.4)
Sodium: 137 mmol/L (ref 134–144)
T3 Uptake Ratio: 26 % (ref 24–39)
T4, Total: 6.1 ug/dL (ref 4.5–12.0)
TSH: 1.81 u[IU]/mL (ref 0.450–4.500)
Total Protein: 6.4 g/dL (ref 6.0–8.5)
Triglycerides: 141 mg/dL (ref 0–149)
URIC ACID: 4 mg/dL (ref 2.5–7.1)
VLDL CHOLESTEROL CAL: 28 mg/dL (ref 5–40)
WBC: 4.8 10*3/uL (ref 3.4–10.8)

## 2018-05-05 LAB — HGB A1C W/O EAG: Hgb A1c MFr Bld: 6.3 % — ABNORMAL HIGH (ref 4.8–5.6)

## 2018-05-05 LAB — VITAMIN D 25 HYDROXY (VIT D DEFICIENCY, FRACTURES): Vit D, 25-Hydroxy: 27 ng/mL — ABNORMAL LOW (ref 30.0–100.0)

## 2018-05-07 NOTE — Progress Notes (Signed)
noted 

## 2018-06-07 ENCOUNTER — Encounter: Payer: Self-pay | Admitting: Registered Nurse

## 2018-06-07 ENCOUNTER — Other Ambulatory Visit: Payer: Self-pay

## 2018-06-07 ENCOUNTER — Ambulatory Visit: Payer: Self-pay | Admitting: Registered Nurse

## 2018-06-07 VITALS — BP 114/75 | HR 71 | Temp 96.6°F

## 2018-06-07 DIAGNOSIS — M542 Cervicalgia: Secondary | ICD-10-CM

## 2018-06-07 DIAGNOSIS — R202 Paresthesia of skin: Secondary | ICD-10-CM

## 2018-06-07 DIAGNOSIS — M7989 Other specified soft tissue disorders: Secondary | ICD-10-CM

## 2018-06-07 DIAGNOSIS — G8929 Other chronic pain: Secondary | ICD-10-CM

## 2018-06-07 DIAGNOSIS — M79632 Pain in left forearm: Secondary | ICD-10-CM

## 2018-06-07 NOTE — Progress Notes (Signed)
Subjective:    Patient ID: Valerie Sparks, female    DOB: 22-Dec-1955, 63 y.o.   MRN: [id number]  63y/o Caucasian established female pt c/o bilateral wrist/hand numbness/tingling x2 weeks. left hand worse with pain and swelling. With certain movements pain shoots up left arm to elbow. Pain and swelling worse throughout day as she uses wrists. Has been picking up extra work through the day for past 2 weeks due to covid 19 furloughed employees not being present in her area. She states she is wearing short wrist brace to bed at night on R wrist to help prevent movement. Denied brace during the day. Using Tramadol leftover from previous flare last year at night to help her sleep through pain. Ibuprofen po prn during the day. Also icing area. Neck pain some nights wakes her up but hasn't been worse than usual.  Trying stretches and repositioning.  Nothing new going on the usual.  Denied loss of bowel/bladder control, saddle paresthesias or arm/leg weakness e.g. dropping items.  Was treated last year by [facility] and [facility] then referred to [facility] had injections performed, meloxicam, tramadol, ketoralac, prednisone courses along with splints resolved her pain.  Unsure if any meloxicam left over at home. Right hand dominant.  FHx father gout  PMHx low vitamin D, degenerative disk disease cervical per xray, elevated HgbA1c mar 2020.  On statin also.     Review of Systems  Constitutional: Positive for activity change. Negative for appetite change, chills, diaphoresis, fatigue and fever.  HENT: Negative for trouble swallowing and voice change.   Eyes: Negative for photophobia and visual disturbance.  Respiratory: Negative for cough, shortness of breath, wheezing and stridor.   Cardiovascular: Negative for chest pain.  Gastrointestinal: Negative for diarrhea, nausea and vomiting.  Endocrine: Negative for cold intolerance and heat intolerance.  Genitourinary: Negative for difficulty urinating.   Musculoskeletal: Positive for arthralgias, joint swelling, myalgias and neck pain. Negative for back pain, gait problem and neck stiffness.  Skin: Negative for color change, pallor, rash and wound.  Allergic/Immunologic: Negative for food allergies.  Neurological: Positive for numbness. Negative for dizziness, tremors, seizures, syncope, facial asymmetry, speech difficulty, weakness, light-headedness and headaches.  Psychiatric/Behavioral: Positive for sleep disturbance. Negative for agitation and confusion.       Objective:   Physical Exam Vitals signs and nursing note reviewed.  Constitutional:      General: She is awake. She is not in acute distress.    Appearance: Normal appearance. She is well-developed and well-groomed. She is not ill-appearing, toxic-appearing or diaphoretic.  HENT:     Head: Normocephalic and atraumatic.     Jaw: There is normal jaw occlusion.     Right Ear: Hearing and external ear normal.     Left Ear: Hearing and external ear normal.     Nose: Nose normal.     Mouth/Throat:     Lips: Pink. No lesions.     Mouth: Mucous membranes are moist.     Tongue: No lesions.     Palate: No mass and lesions.     Pharynx: Oropharynx is clear. Uvula midline. No pharyngeal swelling, oropharyngeal exudate, posterior oropharyngeal erythema or uvula swelling.     Tonsils: No tonsillar exudate or tonsillar abscesses. 0 on the right. 0 on the left.  Eyes:     General: Lids are normal. Vision grossly intact. Gaze aligned appropriately. Allergic shiner present. No visual field deficit or scleral icterus.       Right eye: No discharge.  Left eye: No discharge.     Extraocular Movements: Extraocular movements intact.     Conjunctiva/sclera: Conjunctivae normal.     Pupils: Pupils are equal, round, and reactive to light.  Neck:     Musculoskeletal: Normal range of motion and neck supple. Normal range of motion. Pain with movement present. No edema, erythema, neck  rigidity, crepitus, injury, torticollis, spinous process tenderness or muscular tenderness.     Trachea: Trachea and phonation normal. No tracheal tenderness or tracheal deviation.  Cardiovascular:     Rate and Rhythm: Normal rate.     Pulses: Normal pulses.          Radial pulses are 2+ on the right side and 2+ on the left side.  Pulmonary:     Effort: Pulmonary effort is normal. No respiratory distress.     Breath sounds: Normal breath sounds and air entry. No stridor, decreased air movement or transmitted upper airway sounds. No decreased breath sounds, wheezing, rhonchi or rales.     Comments: No cough observed in exam room spoke full sentences without difficulty Abdominal:     General: Abdomen is flat. There is no distension.  Musculoskeletal:        General: Swelling present. No deformity or signs of injury.     Right shoulder: Normal.     Left shoulder: Normal.     Right wrist: Normal. She exhibits normal range of motion, no tenderness, no bony tenderness, no swelling, no effusion, no crepitus, no deformity and no laceration.     Left wrist: She exhibits tenderness and swelling. She exhibits normal range of motion, no bony tenderness, no effusion, no crepitus, no deformity and no laceration.     Right hip: Normal.     Left hip: Normal.     Right knee: Normal.     Left knee: Normal.     Right ankle: Normal.     Left ankle: Normal.     Cervical back: Normal.     Thoracic back: Normal.     Lumbar back: Normal.     Right forearm: Normal.     Left forearm: She exhibits tenderness, swelling and edema. She exhibits no bony tenderness, no deformity and no laceration.       Arms:     Right hand: Normal.     Left hand: Normal.     Right lower leg: No edema.     Left lower leg: No edema.  Lymphadenopathy:     Head:     Right side of head: No submental, submandibular, tonsillar, preauricular, posterior auricular or occipital adenopathy.     Left side of head: No submental,  submandibular, tonsillar, preauricular, posterior auricular or occipital adenopathy.     Cervical: No cervical adenopathy.     Right cervical: No superficial, deep or posterior cervical adenopathy.    Left cervical: No superficial, deep or posterior cervical adenopathy.  Skin:    General: Skin is warm and dry.     Capillary Refill: Capillary refill takes less than 2 seconds.     Coloration: Skin is not ashen, cyanotic, jaundiced, mottled, pale or sallow.     Findings: No abrasion, abscess, acne, bruising, burn, ecchymosis, erythema, signs of injury, laceration, lesion, petechiae, rash or wound.     Nails: There is no clubbing.   Neurological:     General: No focal deficit present.     Mental Status: She is alert and oriented to person, place, and time. Mental status is at baseline.  GCS: GCS eye subscore is 4. GCS verbal subscore is 5. GCS motor subscore is 6.     Cranial Nerves: Cranial nerves are intact. No cranial nerve deficit, dysarthria or facial asymmetry.     Sensory: Sensation is intact. No sensory deficit.     Motor: Motor function is intact. No weakness, tremor, atrophy, abnormal muscle tone or seizure activity.     Coordination: Coordination is intact. Coordination normal.     Gait: Gait is intact. Gait normal.  Psychiatric:        Attention and Perception: Attention and perception normal.        Mood and Affect: Mood and affect normal.        Speech: Speech normal.        Behavior: Behavior normal. Behavior is cooperative.        Thought Content: Thought content normal.        Cognition and Memory: Cognition and memory normal.        Judgment: Judgment normal.           Assessment & Plan:  A-paresthesias and bilateral wrist pain, History of vitamin D deficiency and elevated blood sugar  P-Discussed with patient restart wrist splints at work and while asleep.  Continue cryotherapy 15 minutes QID.  Restart meloxicam 15mg  po daily. If need refill please notify me  along with pharmacy preference.  Patient will check at home after work.  Stop ibuprofen/naproxen/motrin/advil/aleve when taking meloxicam.  May continue tramadol po prn per Rx discussed with patient I am unable to fill tramadol due to contract limitations and she would need to contact [facility] provider for refill or repeat wrist injections from [facility] provider if no improvement with conservative care.  Consider epsom salt soak and elevation also.  PMHx on statin for lipid elevation discussed could decrease B vitamins consider womens once a day multivitamin.  PMHx Mar 2020 vitamin D deficiency was trying to increase sunlight exposure if weather/rain does not permit increase cholecalciferol to 2000 international units by mouth daily taken with her fattiest meal.  HgbA1c increased March could be gout/pseudogout given low purine diet and gout exitcare handouts printed.  Father with history of gout.  Consider prednisone taper if no relief with NSAID.  Patient refused prednisone at this time will trial NSAID first.  Schedule repeat HgbA1c and Vitamin D level Jun 2020 with RN Hildred Alamin nonfasting.  History of cervical DDD could be pinched nerve   Home stretches demonstrated to patient-e.g. Arm circles, walking up wall, chest stretches, neck AROM, chin tucks, knee to chest and rock side to side on back. Self massage or professional prn, foam roller use or tennis/racquetball.  Heat/cryotherapy 15 minutes QID prn.  Consider cervical thermacare Consider physical therapy referral if no improvement with prescribed therapy from [facility] and/or chiropractic care.  Ensure ergonomics correct desk at work avoid repetitive motions if possible/holding phone/laptop in hand use desk/stand and/or break up lifting items into smaller loads/weights.  Patient was instructed to rest, ice, and ROM exercises.  Activity as tolerated.   Follow up if symptoms persist or worsen especially if loss of bowel/bladder control, arm/leg weakness  and/or saddle paresthesias.  Patient verbalized agreement and understanding of treatment plan and had no further questions at this time.  P2:  Injury Prevention and Fitness.

## 2018-06-07 NOTE — Patient Instructions (Addendum)
Increase cholecalciferol to 2000 international units per day by mouth and sunshine on skin 15 minutes per day Repeat Vitamin D and HgbA1c lab end of June schedule with RN Hildred Alamin nonfasting Restart meloxicam po daily stop advil/aleve/naproxen/ibuprofen/motrin while on meloxicam  Low-Purine Eating Plan A low-purine eating plan involves making food choices to limit your intake of purine. Purine is a kind of uric acid. Too much uric acid in your blood can cause certain conditions, such as gout and kidney stones. Eating a low-purine diet can help control these conditions. What are tips for following this plan? Reading food labels   Avoid foods with saturated or Trans fat.  Check the ingredient list of grains-based foods, such as bread and cereal, to make sure that they contain whole grains.  Check the ingredient list of sauces or soups to make sure they do not contain meat or fish.  When choosing soft drinks, check the ingredient list to make sure they do not contain high-fructose corn syrup. Shopping  Buy plenty of fresh fruits and vegetables.  Avoid buying canned or fresh fish.  Buy dairy products labeled as low-fat or nonfat.  Avoid buying premade or processed foods. These foods are often high in fat, salt (sodium), and added sugar. Cooking  Use olive oil instead of butter when cooking. Oils like olive oil, canola oil, and sunflower oil contain healthy fats. Meal planning  Learn which foods do or do not affect you. If you find out that a food tends to cause your gout symptoms to flare up, avoid eating that food. You can enjoy foods that do not cause problems. If you have any questions about a food item, talk with your dietitian or health care provider.  Limit foods high in fat, especially saturated fat. Fat makes it harder for your body to get rid of uric acid.  Choose foods that are lower in fat and are lean sources of protein. General guidelines  Limit alcohol intake to no more  than 1 drink a day for nonpregnant women and 2 drinks a day for men. One drink equals 12 oz of beer, 5 oz of wine, or 1 oz of hard liquor. Alcohol can affect the way your body gets rid of uric acid.  Drink plenty of water to keep your urine clear or pale yellow. Fluids can help remove uric acid from your body.  If directed by your health care provider, take a vitamin C supplement.  Work with your health care provider and dietitian to develop a plan to achieve or maintain a healthy weight. Losing weight can help reduce uric acid in your blood. What foods are recommended? The items listed may not be a complete list. Talk with your dietitian about what dietary choices are best for you. Foods low in purines Foods low in purines do not need to be limited. These include:  All fruits.  All low-purine vegetables, pickles, and olives.  Breads, pasta, rice, cornbread, and popcorn. Cake and other baked goods.  All dairy foods.  Eggs, nuts, and nut butters.  Spices and condiments, such as salt, herbs, and vinegar.  Plant oils, butter, and margarine.  Water, sugar-free soft drinks, tea, coffee, and cocoa.  Vegetable-based soups, broths, sauces, and gravies. Foods moderate in purines Foods moderate in purines should be limited to the amounts listed.   cup of asparagus, cauliflower, spinach, mushrooms, or green peas, each day.  2/3 cup uncooked oatmeal, each day.   cup dry wheat bran or wheat germ, each day.  2-3 ounces of meat or poultry, each day.  4-6 ounces of shellfish, such as crab, lobster, oysters, or shrimp, each day.  1 cup cooked beans, peas, or lentils, each day.  Soup, broths, or bouillon made from meat or fish. Limit these foods as much as possible. What foods are not recommended? The items listed may not be a complete list. Talk with your dietitian about what dietary choices are best for you. Limit your intake of foods high in purines, including:  Beer and other  alcohol.  Meat-based gravy or sauce.  Canned or fresh fish, such as: ? Anchovies, sardines, herring, and tuna. ? Mussels and scallops. ? Codfish, trout, and haddock.  Berniece Salines.  Organ meats, such as: ? Liver or kidney. ? Tripe. ? Sweetbreads (thymus gland or pancreas).  [employer] biochemist.  Yeast or yeast extract supplements.  Drinks sweetened with high-fructose corn syrup. Summary  Eating a low-purine diet can help control conditions caused by too much uric acid in the body, such as gout or kidney stones.  Choose low-purine foods, limit alcohol, and limit foods high in fat.  You will learn over time which foods do or do not affect you. If you find out that a food tends to cause your gout symptoms to flare up, avoid eating that food. This information is not intended to replace advice given to you by your health care provider. Make sure you discuss any questions you have with your health care provider. Document Released: 05/28/2010 Document Revised: 03/16/2016 Document Reviewed: 03/16/2016 Elsevier Interactive Patient Education  2019 [place].  Gout  Gout is a condition that causes painful swelling of the joints. Gout is a type of inflammation of the joints (arthritis). This condition is caused by having too much uric acid in the body. Uric acid is a chemical that forms when the body breaks down substances called purines. Purines are important for building body proteins. When the body has too much uric acid, sharp crystals can form and build up inside the joints. This causes pain and swelling. Gout attacks can happen quickly and may be very painful (acute gout). Over time, the attacks can affect more joints and become more frequent (chronic gout). Gout can also cause uric acid to build up under the skin and inside the kidneys. What are the causes? This condition is caused by too much uric acid in your blood. This can happen because:  Your kidneys do not remove enough uric acid  from your blood. This is the most common cause.  Your body makes too much uric acid. This can happen with some cancers and cancer treatments. It can also occur if your body is breaking down too many red blood cells (hemolytic anemia).  You eat too many foods that are high in purines. These foods include organ meats and some seafood. Alcohol, especially beer, is also high in purines. A gout attack may be triggered by trauma or stress. What increases the risk? You are more likely to develop this condition if you:  Have a family history of gout.  Are female and middle-aged.  Are female and have gone through menopause.  Are obese.  Frequently drink alcohol, especially beer.  Are dehydrated.  Lose weight too quickly.  Have an organ transplant.  Have lead poisoning.  Take certain medicines, including aspirin, cyclosporine, diuretics, levodopa, and niacin.  Have kidney disease.  Have a skin condition called psoriasis. What are the signs or symptoms? An attack of acute gout happens quickly.  It usually occurs in just one joint. The most common place is the big toe. Attacks often start at night. Other joints that may be affected include joints of the feet, ankle, knee, fingers, wrist, or elbow. Symptoms of this condition may include:  Severe pain.  Warmth.  Swelling.  Stiffness.  Tenderness. The affected joint may be very painful to touch.  Shiny, red, or purple skin.  Chills and fever. Chronic gout may cause symptoms more frequently. More joints may be involved. You may also have white or yellow lumps (tophi) on your hands or feet or in other areas near your joints. How is this diagnosed? This condition is diagnosed based on your symptoms, medical history, and physical exam. You may have tests, such as:  Blood tests to measure uric acid levels.  Removal of joint fluid with a thin needle (aspiration) to look for uric acid crystals.  X-rays to look for joint damage. How is  this treated? Treatment for this condition has two phases: treating an acute attack and preventing future attacks. Acute gout treatment may include medicines to reduce pain and swelling, including:  NSAIDs.  Steroids. These are strong anti-inflammatory medicines that can be taken by mouth (orally) or injected into a joint.  Colchicine. This medicine relieves pain and swelling when it is taken soon after an attack. It can be given by mouth or through an IV. Preventive treatment may include:  Daily use of smaller doses of NSAIDs or colchicine.  Use of a medicine that reduces uric acid levels in your blood.  Changes to your diet. You may need to see a dietitian about what to eat and drink to prevent gout. Follow these instructions at home: During a gout attack   If directed, put ice on the affected area: ? Put ice in a plastic bag. ? Place a towel between your skin and the bag. ? Leave the ice on for 20 minutes, 2-3 times a day.  Raise (elevate) the affected joint above the level of your heart as often as possible.  Rest the joint as much as possible. If the affected joint is in your leg, you may be given crutches to use.  Follow instructions from your health care provider about eating or drinking restrictions. Avoiding future gout attacks  Follow a low-purine diet as told by your dietitian or health care provider. Avoid foods and drinks that are high in purines, including liver, kidney, anchovies, asparagus, herring, mushrooms, mussels, and beer.  Maintain a healthy weight or lose weight if you are overweight. If you want to lose weight, talk with your health care provider. It is important that you do not lose weight too quickly.  Start or maintain an exercise program as told by your health care provider. Eating and drinking  Drink enough fluids to keep your urine pale yellow.  If you drink alcohol: ? Limit how much you use to:  0-1 drink a day for women.  0-2 drinks a day  for men. ? Be aware of how much alcohol is in your drink. In the [place] one drink equals one 12 oz bottle of beer (355 mL) one 5 oz glass of wine (148 mL), or one 1 oz glass of hard liquor (44 mL). General instructions  Take over-the-counter and prescription medicines only as told by your health care provider.  Do not drive or use heavy machinery while taking prescription pain medicine.  Return to your normal activities as told by your health care provider. Ask your  health care provider what activities are safe for you.  Keep all follow-up visits as told by your health care provider. This is important. Contact a health care provider if you have:  Another gout attack.  Continuing symptoms of a gout attack after 10 days of treatment.  Side effects from your medicines.  Chills or a fever.  Burning pain when you urinate.  Pain in your lower back or belly. Get help right away if you:  Have severe or uncontrolled pain.  Cannot urinate. Summary  Gout is painful swelling of the joints caused by inflammation.  The most common site of pain is the big toe, but it can affect other joints in the body.  Medicines and dietary changes can help to prevent and treat gout attacks. This information is not intended to replace advice given to you by your health care provider. Make sure you discuss any questions you have with your health care provider. Document Released: 01/29/2000 Document Revised: 08/23/2017 Document Reviewed: 08/23/2017 Elsevier Interactive Patient Education  2019 [employer].

## 2018-06-08 ENCOUNTER — Telehealth: Payer: Self-pay | Admitting: *Deleted

## 2018-06-08 NOTE — Telephone Encounter (Signed)
Pt called clinic to report that she has 16 tabs Mobic left at home. Took her first one last night. Phone discussion with NP by RN. Pt to continue Mobic for one week and f/u in clinic for re-eval 4/30 to determine if additional Mobic is necessary. Pt made aware via phone voicemail and in person. Denies any further questions/concerns.

## 2018-06-11 NOTE — Telephone Encounter (Signed)
Noted agreed patient to follow up in one week to determine if meloxicam helping her condition and if yes will refill Rx

## 2018-06-14 ENCOUNTER — Other Ambulatory Visit: Payer: Self-pay

## 2018-06-14 ENCOUNTER — Encounter: Payer: Self-pay | Admitting: Registered Nurse

## 2018-06-14 ENCOUNTER — Ambulatory Visit: Payer: Self-pay | Admitting: Registered Nurse

## 2018-06-14 VITALS — BP 116/79 | HR 74

## 2018-06-14 DIAGNOSIS — R202 Paresthesia of skin: Secondary | ICD-10-CM

## 2018-06-14 DIAGNOSIS — R2232 Localized swelling, mass and lump, left upper limb: Secondary | ICD-10-CM

## 2018-06-14 DIAGNOSIS — G8929 Other chronic pain: Secondary | ICD-10-CM

## 2018-06-14 DIAGNOSIS — M542 Cervicalgia: Principal | ICD-10-CM

## 2018-06-14 MED ORDER — MENTHOL (TOPICAL ANALGESIC) 4 % EX GEL: 1.0000 "application " | Freq: Four times a day (QID) | CUTANEOUS | 0 refills | Status: AC | PRN

## 2018-06-14 MED ORDER — MELOXICAM 15 MG PO TABS: 15.0000 mg | ORAL_TABLET | Freq: Every day | ORAL | 0 refills | Status: DC

## 2018-06-14 MED ORDER — PREDNISONE 10 MG PO TABS: ORAL_TABLET | ORAL | 0 refills | Status: AC

## 2018-06-14 MED ORDER — ACETAMINOPHEN 500 MG PO TABS: 1000.0000 mg | ORAL_TABLET | Freq: Four times a day (QID) | ORAL | 0 refills | Status: AC | PRN

## 2018-06-14 NOTE — Patient Instructions (Signed)
Edema  Edema is when you have too much fluid in your body or under your skin. Edema may make your legs, feet, and ankles swell up. Swelling is also common in looser tissues, like around your eyes. This is a common condition. It gets more common as you get older. There are many possible causes of edema. Eating too much salt (sodium) and being on your feet or sitting for a long time can cause edema in your legs, feet, and ankles. Hot weather may make edema worse. Edema is usually painless. Your skin may look swollen or shiny. Follow these instructions at home:  Keep the swollen body part raised (elevated) above the level of your heart when you are sitting or lying down.  Do not sit still or stand for a long time.  Do not wear tight clothes. Do not wear garters on your upper legs.  Exercise your legs. This can help the swelling go down.  Wear elastic bandages as told by your doctor.  Eat a low-salt (low-sodium) diet to reduce fluid as told by your doctor.  Depending on the cause of your swelling, you may need to limit how much fluid you drink (fluid restriction).  Take over-the-counter and prescription medicines only as told by your doctor. Contact a doctor if:  Treatment is not working.  You have heart, liver, or kidney disease and have symptoms of edema.  You have sudden and unexplained weight gain. Get help right away if:  You have shortness of breath or chest pain.  You cannot breathe when you lie down.  You have pain, redness, or warmth in the swollen areas.  You have heart, liver, or kidney disease and get edema all of a sudden.  You have a fever and your symptoms get worse all of a sudden. Summary  Edema is when you have too much fluid in your body or under your skin.  Edema may make your legs, feet, and ankles swell up. Swelling is also common in looser tissues, like around your eyes.  Raise (elevate) the swollen body part above the level of your heart when you are  sitting or lying down.  Follow your doctor's instructions about diet and how much fluid you can drink (fluid restriction). This information is not intended to replace advice given to you by your health care provider. Make sure you discuss any questions you have with your health care provider. Document Released: 07/20/2007 Document Revised: 02/19/2016 Document Reviewed: 02/19/2016 Elsevier Interactive Patient Education  2019 [employer] Cervical Radiculopathy  Cervical radiculopathy happens when a nerve in the neck (cervical nerve) is pinched or bruised. This condition can develop because of an injury or as part of the normal aging process. Pressure on the cervical nerves can cause pain or numbness that runs from the neck all the way down into the arm and fingers. Usually, this condition gets better with rest. Treatment may be needed if the condition does not improve. What are the causes? This condition may be caused by:  Injury.  Slipped (herniated) disk.  Muscle tightness in the neck because of overuse.  Arthritis.  Breakdown or degeneration in the bones and joints of the spine (spondylosis) due to aging.  Bone spurs that may develop near the cervical nerves. What are the signs or symptoms? Symptoms of this condition include:  Pain that runs from the neck to the arm and hand. The pain can be severe or irritating. It may be worse when the neck is moved.  Numbness  or weakness in the affected arm and hand. How is this diagnosed? This condition may be diagnosed based on symptoms, medical history, and a physical exam. You may also have tests, including:  X-rays.  CT scan.  MRI.  Electromyogram (EMG).  Nerve conduction tests. How is this treated? In many cases, treatment is not needed for this condition. With rest, the condition usually gets better over time. If treatment is needed, options may include:  Wearing a soft neck collar for short periods of time.  Physical  therapy to strengthen your neck muscles.  Medicines, such as NSAIDs, oral corticosteroids, or spinal injections.  Surgery. This may be needed if other treatments do not help. Various types of surgery may be done depending on the cause of your problems. Follow these instructions at home: Managing pain  Take over-the-counter and prescription medicines only as told by your health care provider.  If directed, apply ice to the affected area. ? Put ice in a plastic bag. ? Place a towel between your skin and the bag. ? Leave the ice on for 20 minutes, 2-3 times per day.  If ice does not help, you can try using heat. Take a warm shower or warm bath, or use a heat pack as told by your health care provider.  Try a gentle neck and shoulder massage to help relieve symptoms. Activity  Rest as needed. Follow instructions from your health care provider about any restrictions on activities.  Do stretching and strengthening exercises as told by your health care provider or physical therapist. General instructions  If you were given a soft collar, wear it as told by your health care provider.  Use a flat pillow when you sleep.  Keep all follow-up visits as told by your health care provider. This is important. Contact a health care provider if:  Your condition does not improve with treatment. Get help right away if:  Your pain gets much worse and cannot be controlled with medicines.  You have weakness or numbness in your hand, arm, face, or leg.  You have a high fever.  You have a stiff, rigid neck.  You lose control of your bowels or your bladder (have incontinence).  You have trouble with walking, balance, or speaking. This information is not intended to replace advice given to you by your health care provider. Make sure you discuss any questions you have with your health care provider. Document Released: 10/26/2000 Document Revised: 07/09/2015 Document Reviewed: 03/27/2014 Elsevier  Interactive Patient Education  [employer].

## 2018-06-14 NOTE — Progress Notes (Signed)
Subjective:    Patient ID: Valerie Sparks, female    DOB: 05-10-1955, 63 y.o.   MRN: [id number]  63y/o Caucasian established female pt presenting for f/u L arm pain/tingling, forearm/hand swelling. Last seen 23 Apr restarted mobic will run out next week.  Reports pain and swelling are still present. Pain is more tolerable since starting the Mobic. Was 8/10 prior, now typically 4/10. Forgot to take her mobic last night didn't have any tylenol so took a motrin 800mg  this am.  Has biofreeze at home applies prn.  Has been performing stresses.  Mobic helping with pain but swelling hasn't changed.  Wearing splint at work took it off prior to appt because was sweating a lot and wet under splint.  Denied loss of bowel/bladder incontinence/saddle parethesias, falls or dropping items.  Able to open soda cap/bottle with both hands.  Typically left hand stronger than right hand even though she is right hand dominant.  Work has still been busy due to covid 19 social distancing restrictions in her dept.  Neck pain the usual not worse has been using biofreeze or similar other brands prn.  Icing left wrist/hand once a day in the evenings.     Review of Systems  Constitutional: Negative for activity change, appetite change, chills, diaphoresis, fatigue and fever.  HENT: Negative for trouble swallowing and voice change.   Eyes: Negative for photophobia and visual disturbance.  Respiratory: Negative for cough, shortness of breath, wheezing and stridor.   Gastrointestinal: Negative for diarrhea, nausea and vomiting.  Endocrine: Negative for cold intolerance and heat intolerance.  Genitourinary: Negative for difficulty urinating, dysuria and enuresis.  Musculoskeletal: Positive for arthralgias, joint swelling, myalgias and neck pain. Negative for back pain, gait problem and neck stiffness.  Allergic/Immunologic: Positive for environmental allergies. Negative for food allergies.  Neurological: Positive for  weakness and numbness. Negative for dizziness, tremors, seizures, syncope, facial asymmetry, speech difficulty, light-headedness and headaches.  Hematological: Negative for adenopathy. Does not bruise/bleed easily.  Psychiatric/Behavioral: Negative for agitation, confusion and sleep disturbance.       Objective:   Physical Exam Constitutional:      General: She is awake. She is not in acute distress.    Appearance: Normal appearance. She is well-developed, well-groomed and overweight. She is not ill-appearing, toxic-appearing or diaphoretic.  HENT:     Head: Normocephalic and atraumatic.     Jaw: There is normal jaw occlusion.     Salivary Glands: Right salivary gland is not diffusely enlarged or tender. Left salivary gland is not diffusely enlarged or tender.     Right Ear: Hearing and external ear normal.     Left Ear: Hearing and external ear normal.     Nose: Nose normal. No congestion or rhinorrhea.     Mouth/Throat:     Lips: Pink. No lesions.     Mouth: Mucous membranes are moist.     Tongue: No lesions.     Palate: No lesions.     Pharynx: Oropharynx is clear. Uvula midline. No pharyngeal swelling, oropharyngeal exudate, posterior oropharyngeal erythema or uvula swelling.     Tonsils: No tonsillar exudate. 0 on the right. 0 on the left.  Eyes:     General: Lids are normal. Allergic shiner present. No visual field deficit or scleral icterus.       Right eye: No discharge.        Left eye: No discharge.     Extraocular Movements: Extraocular movements intact.  Conjunctiva/sclera: Conjunctivae normal.     Pupils: Pupils are equal, round, and reactive to light.  Neck:     Musculoskeletal: Neck supple. Decreased range of motion. Pain with movement present. No edema, erythema, neck rigidity, crepitus, injury, torticollis, spinous process tenderness or muscular tenderness.     Trachea: Trachea normal.  Cardiovascular:     Rate and Rhythm: Normal rate and regular rhythm.      Pulses:          Radial pulses are 2+ on the right side and 2+ on the left side.     Heart sounds: Normal heart sounds.  Pulmonary:     Effort: Pulmonary effort is normal. No respiratory distress.     Breath sounds: Normal breath sounds and air entry. No stridor, decreased air movement or transmitted upper airway sounds. No decreased breath sounds, wheezing, rhonchi or rales.     Comments: No cough observed in exam room; spoke full sentences without difficulty Abdominal:     General: Abdomen is flat.     Palpations: Abdomen is soft.  Musculoskeletal:        General: Swelling and tenderness present. No deformity.     Right shoulder: Normal.     Left shoulder: Normal.     Right elbow: Normal.    Left elbow: Normal.     Right wrist: Normal. She exhibits normal range of motion, no tenderness, no bony tenderness, no swelling, no effusion, no crepitus, no deformity and no laceration.     Left wrist: She exhibits decreased range of motion, tenderness, swelling and effusion. She exhibits no bony tenderness, no crepitus, no deformity and no laceration.     Right hip: Normal.     Left hip: Normal.     Right knee: Normal.     Left knee: Normal.     Right ankle: Normal.     Left ankle: Normal.     Cervical back: She exhibits decreased range of motion, pain and spasm. She exhibits no tenderness, no bony tenderness, no swelling, no edema, no deformity, no laceration and normal pulse.     Thoracic back: Normal.     Lumbar back: Normal.       Back:     Right forearm: Normal. She exhibits no tenderness, no bony tenderness, no swelling, no edema, no deformity and no laceration.     Left forearm: She exhibits tenderness, swelling and edema. She exhibits no bony tenderness, no deformity and no laceration.       Arms:     Right hand: Normal. She exhibits normal range of motion, no tenderness, no bony tenderness, normal capillary refill, no deformity, no laceration and no swelling. Normal sensation noted.  Normal strength noted. She exhibits no finger abduction, no thumb/finger opposition and no wrist extension trouble.     Left hand: Normal. She exhibits normal range of motion, no tenderness, no bony tenderness, normal two-point discrimination, normal capillary refill, no deformity, no laceration and no swelling. Normal sensation noted. Normal strength noted. She exhibits no finger abduction, no thumb/finger opposition and no wrist extension trouble.     Right lower leg: No edema.     Left lower leg: No edema.  Lymphadenopathy:     Head:     Right side of head: No submental, submandibular, preauricular, posterior auricular or occipital adenopathy.     Left side of head: No submental, submandibular, preauricular, posterior auricular or occipital adenopathy.     Cervical: No cervical adenopathy.  Right cervical: No superficial cervical adenopathy.    Left cervical: No superficial cervical adenopathy.  Skin:    General: Skin is warm and dry.     Capillary Refill: Capillary refill takes less than 2 seconds.     Coloration: Skin is not ashen, cyanotic, jaundiced, mottled, pale or sallow.     Findings: No abrasion, abscess, acne, bruising, burn, ecchymosis, erythema, laceration, lesion, petechiae, rash or wound.     Nails: There is no clubbing.   Neurological:     General: No focal deficit present.     Mental Status: She is alert and oriented to person, place, and time. Mental status is at baseline.     GCS: GCS eye subscore is 4. GCS verbal subscore is 5. GCS motor subscore is 6.     Cranial Nerves: Cranial nerves are intact. No cranial nerve deficit, dysarthria or facial asymmetry.     Sensory: Sensation is intact. No sensory deficit.     Motor: Motor function is intact. No weakness, tremor, atrophy, abnormal muscle tone or seizure activity.     Coordination: Coordination is intact. Coordination normal.     Gait: Gait is intact. Gait normal.     Comments: Gait sure and steady in exam room;  on/off exam table and in/out of chair without difficulty; bilateral hand grasp 5/5 equal  Psychiatric:        Attention and Perception: Attention and perception normal.        Mood and Affect: Mood and affect normal.        Speech: Speech normal.        Behavior: Behavior normal. Behavior is cooperative.        Thought Content: Thought content normal.        Cognition and Memory: Cognition and memory normal.        Judgment: Judgment normal.           Assessment & Plan:  A-paresthesias and bilateral wrist pain, chronic neck pain  P-Continue mobic 15mg  po daily with food #30 RF0 electronic rx to her pharmacy of choice.  Continue wrist splints at work and while asleep.  Continue cryotherapy 15 minutes QID.  Trial prednisone taper 20mg  po x 7 days then 10mg  pox5 days then 5mg  po x 4 days #21 RF0 dispensed from [facility] to patient.  Ensure avoiding dehydration.  Do not take motrin/naproxen/advil/aleve/ibuprofen while taking meloxicam.  Drink water to keep urine clear and yellow tinged and voiding every 4-6 hours.  she would need to contact [facility] provider for refill of tramadol or repeat wrist injections from [facility] provider if no improvement with conservative care.  Consider epsom salt soak and elevation also.    History of cervical DDD could be pinched nerve  Refused thermacare and biofreeze from clinic stock today.  Did request tylenol 1000mg  po QID prn pain given 4 UD from clinic stock.  Home stretches demonstrated to patient-e.g. Arm circles, walking up wall, chest stretches, neck AROM, chin tucks, knee to chest and rock side to side on back. Self massage or professional prn, foam roller use or tennis/racquetball.  Heat/cryotherapy 15 minutes QID prn.  Consider cervical thermacare Consider physical therapy referral if no improvement with prescribed therapy from [facility] and/or chiropractic care.  Ensure ergonomics correct desk at work avoid repetitive motions if possible/holding  phone/laptop in hand use desk/stand and/or break up lifting items into smaller loads/weights.  Patient was instructed to rest, ice, and ROM exercises.  Activity as tolerated.   Exitcare handout  on edema/cervical radiculopathy and paresthesias.  Follow up in one week sooner if symptoms persist or worsen especially if loss of bowel/bladder control, arm/leg weakness and/or saddle paresthesias.  Patient verbalized agreement and understanding of treatment plan and had no further questions at this time.  P2:  [employer].

## 2018-06-25 NOTE — Telephone Encounter (Signed)
Checked in with pt for status update. She reports 2-3 more days of Prednisone taper left. Swelling is resolved. Pain is 50% improved most of the time. Still will have shooting pain up arm from wrist if she grips something too hard. Tries to avoid movements that she knows will exacerbate pain. Will plan f/u again Thursday 5/14 as she will have completed Prednisone by then. She has no further questions/concerns, appreciative of f/u.

## 2018-06-26 NOTE — Telephone Encounter (Signed)
Late entry.  Noted.  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  Not wearing wrist brace at work because pain and swelling down.  Will see how another week on meloxicam is helping her condition and if worsening will schedule with orthopedics.

## 2018-07-10 ENCOUNTER — Other Ambulatory Visit: Payer: Self-pay | Admitting: Registered Nurse

## 2018-07-19 ENCOUNTER — Encounter: Payer: Self-pay | Admitting: Registered Nurse

## 2018-07-19 ENCOUNTER — Telehealth: Payer: Self-pay | Admitting: Registered Nurse

## 2018-07-19 MED ORDER — CYCLOBENZAPRINE HCL 10 MG PO TABS: 5.0000 mg | ORAL_TABLET | Freq: Every evening | ORAL | 0 refills | Status: AC | PRN

## 2018-07-19 NOTE — Telephone Encounter (Signed)
Discussed c-spine/arm/wrist stretches with patient TID.  Cyclobenzaprine dispensed to patient from [facility] #30 RF0.  Continue meloxicam po daily and trial of heat and/or cryotherapy 15 minutes QID prn along with gentle AROM c-spine/shoulder/wrist.

## 2018-07-19 NOTE — Telephone Encounter (Signed)
Having some improvement with meloxicam in chronic swelling but still intermittent pain and numbness arm/hand left despite wrist splint wear at night.  Neck has been bothering her trying different pillows not working.  Planning to make appt with [facility] in [place], [place] as required wrist injection last time it flared.  Trial flexeril 1/2-1 tab po qhs prn muscle spasms.  Discussed can cause drowsiness and lowered blood pressure sometimes dizzyness with quick position changes avoid driving and taking alcohol after ingesting flexeril/cyclobenzapril.  Patient verbalized understanding information/instructions, agreed with plan of care and had no further questions at this time.

## 2018-07-31 ENCOUNTER — Telehealth: Payer: Self-pay | Admitting: Registered Nurse

## 2018-07-31 ENCOUNTER — Encounter: Payer: Self-pay | Admitting: Registered Nurse

## 2018-07-31 NOTE — Telephone Encounter (Signed)
Patient reported intermittent left wrist/hand swelling continues along with neck pain chronic using flexeril and meloxicam prn not taking daily along with stretches and icing.  Icing definitely helps with soft tissue swelling hand/wrist.  Has not made appt with [facility] yet.  She started wearing her copper bracelets again last week as they helped last year.  Reiterated with patient to wear wrist splints, AROM/stretching c-spine/shoulder/wrist, cryotherapy 15 minutes QID and taking meloxicam every day with food by mouth and cyclobenzaprine prn muscle spasms neck.  Schedule appt with [facility] and follow up with me/RN Hildred Alamin if new or worsening symptoms/red flags e.g. arm weakness/loss of bowel/bladder control and/or saddle paresthesias.  Patient verbalized understanding information/instructions, agreed with plan of care and had no further questions at this time.

## 2018-08-07 ENCOUNTER — Other Ambulatory Visit: Payer: Self-pay | Admitting: Registered Nurse

## 2018-08-07 ENCOUNTER — Telehealth: Payer: Self-pay | Admitting: *Deleted

## 2018-08-07 NOTE — Telephone Encounter (Signed)
Auto refill request for meloxicam in from [facility]. Discussed with pt over phone. Pt declines need for med at this time. She was taking prn, now daily so last fill is lasting longer than Rx'd. Planning f/u with [facility] this week likely. Has not made appt yet. Was putting it off trying copper wrist supports, but they made her chronic wrist swelling worse. Wearing her wrist splints at night. Using meloxicam daily and flexeril prn at night. Wants to wait to see what [facility] suggests before refilling med.

## 2018-08-28 ENCOUNTER — Encounter: Payer: Self-pay | Admitting: Registered Nurse

## 2018-08-28 ENCOUNTER — Telehealth: Payer: Self-pay | Admitting: Registered Nurse

## 2018-08-28 NOTE — Telephone Encounter (Signed)
Patient presented to clinic asking if we had tested her for rheumatoid arthritis as her [facility] provider in [place] would like the results. Per chart review her [place] provider had tested patient 09/13/2017 and negative results at that time forwarded to her provider at [facility] electronically per patient request by RN Hildred Alamin.  Patient reported some swelling and pain this week and difficulty sleeping again.  Trying stretching.  She stated orthopedics provider stated she may be a surgical candidate and will be determined at next follow up visit if carpal tunnel release indicated.  Encouraged patient to keep her orthopedics follow up appt.  She is not wearing wrist splints at work today.  Patient verbalized understanding information/instructions, agreed with plan of care and had no further questions at this time.

## 2018-10-09 ENCOUNTER — Encounter: Payer: Self-pay | Admitting: Registered Nurse

## 2018-10-09 ENCOUNTER — Telehealth: Payer: Self-pay | Admitting: Registered Nurse

## 2018-10-09 NOTE — Telephone Encounter (Signed)
Patient reported she has scheduled carpal tunnel release and left index finger trigger release surgery on 10/24/2018; preop 10/19/2018.  Conservative therapy did not resolve her pain/swelling.  Recently tried prednisone 4mg  dose pack also in addition to meloxicam/splint/ice/stretches.

## 2018-10-15 ENCOUNTER — Other Ambulatory Visit: Payer: Self-pay

## 2018-10-15 ENCOUNTER — Ambulatory Visit: Payer: Self-pay | Admitting: *Deleted

## 2018-10-15 DIAGNOSIS — R7303 Prediabetes: Secondary | ICD-10-CM

## 2018-10-15 DIAGNOSIS — Z0000 Encounter for general adult medical examination without abnormal findings: Secondary | ICD-10-CM

## 2018-10-15 NOTE — Progress Notes (Signed)
Attempted lab draw for nonfasting A1c and Vit D as scheduled for June 2020 after March 2020 results. Attempt x1 unsuccessful. Pt not well hydrated today and veins not easily palpable. Rescheduled to return tomorrow after hydrating today and will reattempt.  Pt last day on site 10/18/18 prior to 6 week medical leave for upcoming hand/finger surgery.

## 2018-10-16 ENCOUNTER — Ambulatory Visit: Payer: Self-pay | Admitting: *Deleted

## 2018-10-16 DIAGNOSIS — R202 Paresthesia of skin: Secondary | ICD-10-CM

## 2018-10-16 NOTE — Progress Notes (Signed)
Labs originally scheduled in June 2020 as repeat/f/u from March 2020 labs.

## 2018-10-17 LAB — VITAMIN D 25 HYDROXY (VIT D DEFICIENCY, FRACTURES): Vit D, 25-Hydroxy: 28.6 ng/mL — ABNORMAL LOW (ref 30.0–100.0)

## 2018-10-17 LAB — HGB A1C W/O EAG: Hgb A1c MFr Bld: 5.9 % — ABNORMAL HIGH (ref 4.8–5.6)

## 2018-10-18 NOTE — Progress Notes (Signed)
Pt confirmed dinner is typically her fattiest meal.

## 2018-10-18 NOTE — Progress Notes (Signed)
noted 

## 2018-11-15 ENCOUNTER — Telehealth: Payer: Self-pay | Admitting: Registered Nurse

## 2018-11-15 ENCOUNTER — Encounter: Payer: Self-pay | Admitting: Registered Nurse

## 2018-11-15 NOTE — Telephone Encounter (Signed)
RN spoke with pt over phone on Tues 11/14/2018. Pt reported she was feeling well and healing well. Pain and numbness she was experiencing are greatly improved. Sutures removed in Ortho office on 11/07/18. She stated she had noticed some redness and puffiness around suture line and increasing soreness to touch in the prior 24hrs. She had started applying triple abx ointment to area and voiced that she was going to reassess it the next morning and if not improving, she would call surgeon's office for recommendations. She also informed RN of her current restrictions including 5lb wt limit and no submersion of hand. She has f/u appt scheduled and is hoping to be released back to work at that time. She has no further questions or concerns.

## 2018-11-15 NOTE — Telephone Encounter (Signed)
Attempted to contact patient to follow up post surgery.  No answer and voicemail not set up.  Patient still not back to work.  Coworkers state patient has reported she is doing well and in less pain.  Per Epic care everywhere note last saw surgeon 11/07/2018 sutures removed; limitations no lifting/pushing/pulling over 5 lbs for another 4 weeks and no submerging hand in water for 4 weeks.  Next appt in 4 weeks.

## 2018-11-15 NOTE — Telephone Encounter (Signed)
Noted agree with plan

## 2018-11-28 LAB — HM MAMMOGRAPHY

## 2018-12-03 ENCOUNTER — Encounter: Payer: Self-pay | Admitting: Primary Care

## 2018-12-03 ENCOUNTER — Ambulatory Visit: Payer: PRIVATE HEALTH INSURANCE | Admitting: Primary Care

## 2018-12-03 ENCOUNTER — Other Ambulatory Visit: Payer: Self-pay

## 2018-12-03 VITALS — BP 122/82 | HR 82 | Temp 97.9°F | Ht 63.5 in | Wt 174.5 lb

## 2018-12-03 DIAGNOSIS — B029 Zoster without complications: Secondary | ICD-10-CM

## 2018-12-03 HISTORY — DX: Zoster without complications: B02.9

## 2018-12-03 MED ORDER — VALACYCLOVIR HCL 1 G PO TABS: 1000.0000 mg | ORAL_TABLET | Freq: Three times a day (TID) | ORAL | 0 refills | Status: DC

## 2018-12-03 NOTE — Progress Notes (Signed)
Subjective:    Patient ID: Valerie Sparks, female    DOB: 11-26-1955, 63 y.o.   MRN: [id number]  HPI  Valerie Sparks is a 63 year old female with a history of vitamin D deficiency, hyperlipidemia, prediabetes who presents today with a chief complaint of rash.  Two weeks ago she was evaluated at her GYN's office, diagnosed with bacterial vaginosis and prescribed metronidazole gel for which she has since completed.   Three days ago she noticed a few bumps to the right buttocks so she scratched them not thinking anything of it. The following day she noticed an increased rash down to right posterior lower extremity with discomfort, burning, and itching. She's applied Calamine lotion and taken Benadryl without much improvement.   No new lotions, detergents, soaps or shampoos. No new vitamins, supplements. No new pets. No recent outdoor exposure or poison ivy exposure. No bonfire or smoke exposure.  No recent motel or hotel stay or new beds.   No fevers/chills, oral lesions, new joint pains, tick bites, abdominal pain, nausea.   She has not had her shingles vaccinations. She has been under a lot of stress over the last several months.   Review of Systems  Constitutional: Negative for fever.  Respiratory: Negative for shortness of breath.   Skin: Positive for rash.  Neurological: Negative for numbness.       Past Medical History:  Diagnosis Date  . Allergy   . Frequent headaches   . Hyperlipidemia      Social History   Socioeconomic History  . Marital status: Married    Spouse name: Not on file  . Number of children: Not on file  . Years of education: Not on file  . Highest education level: Not on file  Occupational History  . Not on file  Social Needs  . Financial resource strain: Not on file  . Food insecurity    Worry: Not on file    Inability: Not on file  . Transportation needs    Medical: Not on file    Non-medical: Not on file  Tobacco Use  . Smoking status:  Never Smoker  . Smokeless tobacco: Never Used  Substance and Sexual Activity  . Alcohol use: Yes    Comment: very rarely  . Drug use: No  . Sexual activity: Yes  Lifestyle  . Physical activity    Days per week: Not on file    Minutes per session: Not on file  . Stress: Not on file  Relationships  . Social Herbalist on phone: Not on file    Gets together: Not on file    Attends religious service: Not on file    Active member of club or organization: Not on file    Attends meetings of clubs or organizations: Not on file    Relationship status: Not on file  . Intimate partner violence    Fear of current or ex partner: Not on file    Emotionally abused: Not on file    Physically abused: Not on file    Forced sexual activity: Not on file  Other Topics Concern  . Not on file  Social History Narrative  . Not on file    Past Surgical History:  Procedure Laterality Date  . ABLATION    . BUNIONECTOMY     right  . TOOTH EXTRACTION    . TUBAL LIGATION      Family History  Problem Relation Age of Onset  .  Hypertension Mother   . Diabetes Mother   . Heart disease Father   . Diabetes Sister   . Diabetes Brother   . Breast cancer Paternal Aunt 13  . Cancer Maternal Grandmother   . Kidney disease Paternal Grandfather   . Colon cancer Neg Hx   . Esophageal cancer Neg Hx   . Rectal cancer Neg Hx   . Stomach cancer Neg Hx     Allergies  Allergen Reactions  . Actifed Cold-Allergy [Chlorpheniramine-Phenyleph Er]   . Pseudoephedrine Other (See Comments)    "takes me out of my skin"    Current Outpatient Medications on File Prior to Visit  Medication Sig Dispense Refill  . cholecalciferol (VITAMIN D) 1000 units tablet Take 1,000 Units by mouth daily.    [name] ibuprofen (ADVIL) 800 MG tablet Take 800 mg by mouth 3 (three) times daily.    . simvastatin (ZOCOR) 20 MG tablet Take 1 tablet by mouth every evening for cholesterol. 90 tablet 3   No current  facility-administered medications on file prior to visit.     BP 122/82   Pulse 82   Temp 97.9 F (36.6 C) (Temporal)   Ht 5' 3.5" (1.613 m)   Wt 174 lb 8 oz (79.2 kg)   SpO2 98%   BMI 30.43 kg/m    Objective:   Physical Exam  Constitutional: She appears well-nourished.  Respiratory: Effort normal.  Skin: Skin is warm and dry. Rash noted.  Several closed vesicles to the right buttocks, several other small rashes to right posterior lower extremity.            Assessment & Plan:

## 2018-12-03 NOTE — Assessment & Plan Note (Signed)
Acute for the last several days. Exam today suggestive of herpes zoster. She has not had any shingles vaccinations.  Rx for Valtrex course sent to pharmacy. Discussed home care/supportive treatment.  Return precautions provided.

## 2018-12-03 NOTE — Patient Instructions (Signed)
Start valacyclovir 1000 mg tablets. Take 1 tablet by mouth three times daily for seven days.  Please call me if your symptoms do not start to improve within 3-4 days.  It was a pleasure to see you today!

## 2018-12-04 ENCOUNTER — Encounter: Payer: Self-pay | Admitting: Primary Care

## 2018-12-10 ENCOUNTER — Telehealth: Payer: Self-pay

## 2018-12-10 NOTE — Telephone Encounter (Signed)
Patient called and left voicemail on triage line asking for a return to work note for November 2nd per her job. [name] treated patient for shingles) Patient states she finished her medication for Shingles yesterday-12/09/2018, and she is still experiencing pain and scabbing and per work she is to it and return November 2nd but needs work note faxed over. Patient states when the nurse calls her back she would give fax number and to whom it needs to be sent. Please review. CB [phone]

## 2018-12-10 NOTE — Telephone Encounter (Signed)
Work note printed and placed in [employer].

## 2018-12-10 NOTE — Telephone Encounter (Signed)
Spoken to patient earlier today. Patient requested work note to fax to [phone] attn: Maurice March

## 2018-12-18 ENCOUNTER — Telehealth: Payer: Self-pay | Admitting: Registered Nurse

## 2018-12-18 ENCOUNTER — Encounter: Payer: Self-pay | Admitting: Registered Nurse

## 2018-12-18 NOTE — Telephone Encounter (Signed)
Patient back at work wrists and trigger finger feeling well but still having some nerve pain/mild itching at scabs from zoster.  Patient has been applying aveeno lotion prn to right buttocks and thigh and wearing boy shorts instead of female briefs to help with clothing not to rub on rash/scabs.  She has picked a couple off.  Denied purulent discharge/fever/chills.  Discussed with patient I recommend emollient vaseline, coconut oil on scabs/dry skin to help prevent itching.  Avoid scratching and picking at affected areas.  If signs and symptoms of infection to follow up for re-evaluation and reminded patient if another outbreak needs to start valtrex on day 1; discussed if [facility] and [facility] closed consider teledoc.  Patient verbalized understanding information/instructions, agreed with plan of care and had no further questions at this time.

## 2019-01-07 ENCOUNTER — Telehealth: Payer: Self-pay | Admitting: *Deleted

## 2019-01-07 ENCOUNTER — Other Ambulatory Visit: Payer: Self-pay | Admitting: *Deleted

## 2019-01-07 DIAGNOSIS — Z20822 Contact with and (suspected) exposure to covid-19: Secondary | ICD-10-CM

## 2019-01-07 DIAGNOSIS — Z20828 Contact with and (suspected) exposure to other viral communicable diseases: Secondary | ICD-10-CM

## 2019-01-07 NOTE — Telephone Encounter (Signed)
Pt in to clinic reporting she had dinner in a restaurant with her daughter, son-in-law, and son-in-law's parents on Saturday 12/29/18. Found out late Friday night that her son-in-law's father was in the ER and had a positive rapid Covid test. Her son-in-law is now awaiting results. Pt was on-site at [employer] all of last week, including Saturday, 2 days ago. She questioned RN about quarantine timeline and thought her exposure was long enough ago to be okay to report to work. Advised her of 14 day quarantine recommendations as she denies any current sx and feels well.  Scheduled pt for testing at [facility] drive thru site for today, 11/23. Advised she will quarantine the remainder of this week through 11/28 and return to work on Monday 11/30 as long as she does not develop sx that are not improving and is fever free 24hrs without antipyretics prior to returning. She verbalizes understanding and agreement.  HR manager Hyacinth Meeker made aware of exposure, quarantine, testing. Safety Manager Leonie Green made aware in order to conduct contact tracing. Pt advised both would be contacting her with more details and questions. Instructed pt to call clinic if she develops any sx. ER precautions given for severe sx. Pt going to gather belongings and inform supervisor of being sent home. Pt denies further questions/concerns.

## 2019-01-07 NOTE — Telephone Encounter (Signed)
Noted agree with plan of care.  Ensure patient aware of red flag symptoms and when to go to ER/call 911 also.

## 2019-01-08 NOTE — Telephone Encounter (Signed)
Noted [facility] closed until 30 Nov due to holiday.  I will follow up with patient tomorrow as working in another [employer] clinic.

## 2019-01-08 NOTE — Telephone Encounter (Signed)
Spoke with pt over phone. She reports Covid test was performed yesterday. Awaiting results. Pt reports she has rhinorrhea and PND that has developed over the past 2 days. Using Flonase, nasal saline spray, and Claritin at home. Sts her daughter reported to her that son-in-law has resulted positive. Daughter tested yesterday, awaiting results.   MyChart activated yesterday. Will contact pt when results available later this week. Pt advised to call clinic or email if sx worsen, fail to improve, or new sx begin. ER precautions reviewed again. Pt denies further questions/concerns.

## 2019-01-09 LAB — NOVEL CORONAVIRUS, NAA: SARS-CoV-2, NAA: NOT DETECTED

## 2019-01-10 NOTE — Telephone Encounter (Signed)
Covid PCR test negative continue quarantine/plan of care as previously discussed. [employer] HR Rep Tim notified.

## 2019-01-11 NOTE — Telephone Encounter (Signed)
Patient viewed my chart 01/10/2019 per epic

## 2019-01-15 NOTE — Telephone Encounter (Signed)
noted 

## 2019-01-15 NOTE — Telephone Encounter (Signed)
Pt returned to work yesterday as planned. Pt in to clinic this morning requesting flu vaccine. Scheduled for later this week. She denies any covid sx currently or while she was quarantining. Sts daughter is still sick with fever and fatigue for more than 2 weeks now, but remainder of family members are asymptomatic and well. She voices appreciation for clinic staff's follow ups and check ins and denies any further questions/concerns.

## 2019-01-18 ENCOUNTER — Encounter: Payer: Self-pay | Admitting: *Deleted

## 2019-02-15 HISTORY — PX: CARPAL TUNNEL RELEASE: SHX101

## 2019-04-09 ENCOUNTER — Encounter: Payer: Self-pay | Admitting: Registered Nurse

## 2019-04-09 ENCOUNTER — Telehealth: Payer: Self-pay | Admitting: Registered Nurse

## 2019-04-09 NOTE — Telephone Encounter (Signed)
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  She doesn't want to have another surgery left hand this time.  Right wrist still having some fluid buildup/swelling end of day dorsum hand/wrist and she received injection after carpal tunnel and trigger finger release but it didn't help with swelling.  Stated able to perform her duties at this time but concerned cysts returned/enlarging.  Patient had no further questions or concerns at this time and will follow up prn.

## 2019-05-02 ENCOUNTER — Encounter: Payer: Self-pay | Admitting: Registered Nurse

## 2019-05-02 ENCOUNTER — Telehealth: Payer: Self-pay | Admitting: Registered Nurse

## 2019-05-02 DIAGNOSIS — E782 Mixed hyperlipidemia: Secondary | ICD-10-CM

## 2019-05-02 MED ORDER — SIMVASTATIN 20 MG PO TABS: ORAL_TABLET | ORAL | 0 refills | Status: DC

## 2019-05-02 NOTE — Telephone Encounter (Signed)
Patient requested refill of simvastatin 20mg  po daily.  Last PCP visit 2019 Aug 16th Rx expired.  Bridge refill 90 days supply given from [place] today to patient and instructed to contact PCM to schedule appt and due for annual labs also last completed 05/04/2018 executive panel low vitamin D, elevated HgbA1c and glucose.  Repeat vitamin D 10/16/2018 still low per epic review.  Patient verbalized understanding information/instructions and had no further questions at this time.

## 2019-05-09 ENCOUNTER — Ambulatory Visit: Payer: PRIVATE HEALTH INSURANCE | Admitting: Primary Care

## 2019-05-10 ENCOUNTER — Other Ambulatory Visit: Payer: Self-pay

## 2019-05-10 ENCOUNTER — Encounter: Payer: Self-pay | Admitting: Primary Care

## 2019-05-10 ENCOUNTER — Ambulatory Visit: Payer: PRIVATE HEALTH INSURANCE | Admitting: Primary Care

## 2019-05-10 VITALS — BP 122/74 | HR 69 | Temp 96.6°F | Ht 63.5 in | Wt 175.5 lb

## 2019-05-10 DIAGNOSIS — Z0000 Encounter for general adult medical examination without abnormal findings: Secondary | ICD-10-CM | POA: Diagnosis not present

## 2019-05-10 DIAGNOSIS — G5603 Carpal tunnel syndrome, bilateral upper limbs: Secondary | ICD-10-CM | POA: Diagnosis not present

## 2019-05-10 DIAGNOSIS — R7303 Prediabetes: Secondary | ICD-10-CM

## 2019-05-10 DIAGNOSIS — E559 Vitamin D deficiency, unspecified: Secondary | ICD-10-CM | POA: Diagnosis not present

## 2019-05-10 DIAGNOSIS — Z23 Encounter for immunization: Secondary | ICD-10-CM

## 2019-05-10 DIAGNOSIS — G5600 Carpal tunnel syndrome, unspecified upper limb: Secondary | ICD-10-CM | POA: Insufficient documentation

## 2019-05-10 DIAGNOSIS — E782 Mixed hyperlipidemia: Secondary | ICD-10-CM | POA: Diagnosis not present

## 2019-05-10 LAB — COMPREHENSIVE METABOLIC PANEL
ALT: 14 U/L (ref 0–35)
AST: 17 U/L (ref 0–37)
Albumin: 4.3 g/dL (ref 3.5–5.2)
Alkaline Phosphatase: 54 U/L (ref 39–117)
BUN: 17 mg/dL (ref 6–23)
CO2: 28 mEq/L (ref 19–32)
Calcium: 9.2 mg/dL (ref 8.4–10.5)
Chloride: 103 mEq/L (ref 96–112)
Creatinine, Ser: 0.79 mg/dL (ref 0.40–1.20)
GFR: 73.48 mL/min (ref >60.00)
Glucose, Bld: 116 mg/dL — ABNORMAL HIGH (ref 70–99)
Potassium: 3.7 mEq/L (ref 3.5–5.1)
Sodium: 138 mEq/L (ref 135–145)
Total Bilirubin: 0.6 mg/dL (ref 0.2–1.2)
Total Protein: 6.4 g/dL (ref 6.0–8.3)

## 2019-05-10 LAB — CBC
HCT: 40.6 % (ref 36.0–46.0)
Hemoglobin: 13.7 g/dL (ref 12.0–15.0)
MCHC: 33.8 g/dL (ref 30.0–36.0)
MCV: 83.5 fl (ref 78.0–100.0)
Platelets: 311 10*3/uL (ref 150.0–400.0)
RBC: 4.86 Mil/uL (ref 3.87–5.11)
RDW: 13.4 % (ref 11.5–15.5)
WBC: 6.2 10*3/uL (ref 4.0–10.5)

## 2019-05-10 LAB — LIPID PANEL
Cholesterol: 180 mg/dL (ref 0–200)
HDL: 50.7 mg/dL (ref >39.00)
LDL Cholesterol: 91 mg/dL (ref 0–99)
NonHDL: 129.34
Total CHOL/HDL Ratio: 4
Triglycerides: 192 mg/dL — ABNORMAL HIGH (ref 0.0–149.0)
VLDL: 38.4 mg/dL (ref 0.0–40.0)

## 2019-05-10 LAB — HEMOGLOBIN A1C: Hgb A1c MFr Bld: 6.1 % (ref 4.6–6.5)

## 2019-05-10 LAB — VITAMIN D 25 HYDROXY (VIT D DEFICIENCY, FRACTURES): VITD: 29.85 ng/mL — ABNORMAL LOW (ref 30.00–100.00)

## 2019-05-10 MED ORDER — SIMVASTATIN 20 MG PO TABS: ORAL_TABLET | ORAL | 3 refills | Status: DC

## 2019-05-10 MED ORDER — IBUPROFEN 800 MG PO TABS: 800.0000 mg | ORAL_TABLET | Freq: Every day | ORAL | 0 refills | Status: DC | PRN

## 2019-05-10 NOTE — Assessment & Plan Note (Signed)
Uses Ibuprofen 800 mg every other day as needed for intermittent carpal tunnel flares. Refill sent to pharmacy.

## 2019-05-10 NOTE — Addendum Note (Signed)
Addended by: Jacqualin Combes on: 05/10/2019 02:43 PM   Modules accepted: Orders

## 2019-05-10 NOTE — Patient Instructions (Signed)
Start exercising. You should be getting 150 minutes of moderate intensity exercise weekly.  It's important to improve your diet by reducing consumption of fast food, fried food, processed snack foods, sugary drinks. Increase consumption of fresh vegetables and fruits, whole grains, water.  Ensure you are drinking 64 ounces of water daily.  Stop by the lab prior to leaving today. I will notify you of your results once received.   It was a pleasure to see you today!   Preventive Care 63-66 Years Old, Female Preventive care refers to visits with your health care provider and lifestyle choices that can promote health and wellness. This includes:  A yearly physical exam. This may also be called an annual well check.  Regular dental visits and eye exams.  Immunizations.  Screening for certain conditions.  Healthy lifestyle choices, such as eating a healthy diet, getting regular exercise, not using drugs or products that contain nicotine and tobacco, and limiting alcohol use. What can I expect for my preventive care visit? Physical exam Your health care provider will check your:  Height and weight. This may be used to calculate body mass index (BMI), which tells if you are at a healthy weight.  Heart rate and blood pressure.  Skin for abnormal spots. Counseling Your health care provider may ask you questions about your:  Alcohol, tobacco, and drug use.  Emotional well-being.  Home and relationship well-being.  Sexual activity.  Eating habits.  Work and work Statistician.  Method of birth control.  Menstrual cycle.  Pregnancy history. What immunizations do I need?  Influenza (flu) vaccine  This is recommended every year. Tetanus, diphtheria, and pertussis (Tdap) vaccine  You may need a Td booster every 10 years. Varicella (chickenpox) vaccine  You may need this if you have not been vaccinated. Zoster (shingles) vaccine  You may need this after age 27. Measles,  mumps, and rubella (MMR) vaccine  You may need at least one dose of MMR if you were born in 1957 or later. You may also need a second dose. Pneumococcal conjugate (PCV13) vaccine  You may need this if you have certain conditions and were not previously vaccinated. Pneumococcal polysaccharide (PPSV23) vaccine  You may need one or two doses if you smoke cigarettes or if you have certain conditions. Meningococcal conjugate (MenACWY) vaccine  You may need this if you have certain conditions. Hepatitis A vaccine  You may need this if you have certain conditions or if you travel or work in places where you may be exposed to hepatitis A. Hepatitis B vaccine  You may need this if you have certain conditions or if you travel or work in places where you may be exposed to hepatitis B. Haemophilus influenzae type b (Hib) vaccine  You may need this if you have certain conditions. Human papillomavirus (HPV) vaccine  If recommended by your health care provider, you may need three doses over 6 months. You may receive vaccines as individual doses or as more than one vaccine together in one shot (combination vaccines). Talk with your health care provider about the risks and benefits of combination vaccines. What tests do I need? Blood tests  Lipid and cholesterol levels. These may be checked every 5 years, or more frequently if you are over 55 years old.  Hepatitis C test.  Hepatitis B test. Screening  Lung cancer screening. You may have this screening every year starting at age 63 if you have a 30-pack-year history of smoking and currently smoke or have  quit within the past 15 years.  Colorectal cancer screening. All adults should have this screening starting at age 63 and continuing until age 63. Your health care provider may recommend screening at age 82 if you are at increased risk. You will have tests every 1-10 years, depending on your results and the type of screening test.  Diabetes  screening. This is done by checking your blood sugar (glucose) after you have not eaten for a while (fasting). You may have this done every 1-3 years.  Mammogram. This may be done every 1-2 years. Talk with your health care provider about when you should start having regular mammograms. This may depend on whether you have a family history of breast cancer.  BRCA-related cancer screening. This may be done if you have a family history of breast, ovarian, tubal, or peritoneal cancers.  Pelvic exam and Pap test. This may be done every 3 years starting at age 63. Starting at age 63, this may be done every 5 years if you have a Pap test in combination with an HPV test. Other tests  Sexually transmitted disease (STD) testing.  Bone density scan. This is done to screen for osteoporosis. You may have this scan if you are at high risk for osteoporosis. Follow these instructions at home: Eating and drinking  Eat a diet that includes fresh fruits and vegetables, whole grains, lean protein, and low-fat dairy.  Take vitamin and mineral supplements as recommended by your health care provider.  Do not drink alcohol if: ? Your health care provider tells you not to drink. ? You are pregnant, may be pregnant, or are planning to become pregnant.  If you drink alcohol: ? Limit how much you have to 0-1 drink a day. ? Be aware of how much alcohol is in your drink. In the [place] one drink equals one 12 oz bottle of beer (355 mL), one 5 oz glass of wine (148 mL), or one 1 oz glass of hard liquor (44 mL). Lifestyle  Take daily care of your teeth and gums.  Stay active. Exercise for at least 30 minutes on 5 or more days each week.  Do not use any products that contain nicotine or tobacco, such as cigarettes, e-cigarettes, and chewing tobacco. If you need help quitting, ask your health care provider.  If you are sexually active, practice safe sex. Use a condom or other form of birth control (contraception) in  order to prevent pregnancy and STIs (sexually transmitted infections).  If told by your health care provider, take low-dose aspirin daily starting at age 31. What's next?  Visit your health care provider once a year for a well check visit.  Ask your health care provider how often you should have your eyes and teeth checked.  Stay up to date on all vaccines. This information is not intended to replace advice given to you by your health care provider. Make sure you discuss any questions you have with your health care provider. Document Revised: 10/12/2017 Document Reviewed: 10/12/2017 Elsevier Patient Education  2020 [employer].

## 2019-05-10 NOTE — Assessment & Plan Note (Signed)
Tetanus due, provided today. Mammogram UTD. Colonoscopy UTD, due in 2025. Encouraged a healthy diet, regular exercise. Exam today unremarkable. Labs pending.

## 2019-05-10 NOTE — Assessment & Plan Note (Signed)
Compliant to vitamin D 1000 units daily, repeat Vitamin d level pending.

## 2019-05-10 NOTE — Assessment & Plan Note (Signed)
Encouraged a healthy diet and regular exercise, repeat A1C pending.

## 2019-05-10 NOTE — Progress Notes (Signed)
Subjective:    Patient ID: Valerie Sparks, female    DOB: 1956/01/20, 64 y.o.   MRN: [id number]  HPI  This visit occurred during the SARS-CoV-2 public health emergency.  Safety protocols were in place, including screening questions prior to the visit, additional usage of staff PPE, and extensive cleaning of exam room while observing appropriate contact time as indicated for disinfecting solutions.   Valerie Sparks is a 64 year old female who presents today for complete physical.  Immunizations: -Tetanus: Completed in 2002 -Influenza: Completed this season  -Shingles: Shingles vaccination deferred, had Shingles in October 2020.  Diet: She endorses a fair diet.  Exercise: She is walking some.  Eye exam: Completed in 2020 Dental exam: Due, she will schedule  Pap Smear: Due in 2022. Mammogram: Completed in October 2020 Colonoscopy: Completed in 2015, due in 2025 Hep C Screen: Due  BP Readings from Last 3 Encounters:  05/10/19 122/74  12/03/18 122/82  06/14/18 116/79     Review of Systems  Constitutional: Negative for unexpected weight change.  HENT: Negative for rhinorrhea.   Respiratory: Negative for cough and shortness of breath.   Cardiovascular: Negative for chest pain.  Gastrointestinal: Negative for constipation and diarrhea.  Genitourinary: Negative for difficulty urinating.  Musculoskeletal: Negative for arthralgias and myalgias.  Skin: Negative for rash.  Allergic/Immunologic: Negative for environmental allergies.  Neurological: Negative for dizziness and headaches.       Intermittent tingling since Shingles diagnosis   Psychiatric/Behavioral: The patient is not nervous/anxious.        Past Medical History:  Diagnosis Date  . Allergy   . Frequent headaches   . Hyperlipidemia      Social History   Socioeconomic History  . Marital status: Married    Spouse name: Not on file  . Number of children: Not on file  . Years of education: Not on file  .  Highest education level: Not on file  Occupational History  . Not on file  Tobacco Use  . Smoking status: Never Smoker  . Smokeless tobacco: Never Used  Substance and Sexual Activity  . Alcohol use: Yes    Comment: very rarely  . Drug use: No  . Sexual activity: Yes  Other Topics Concern  . Not on file  Social History Narrative  . Not on file   Social Determinants of Health   Financial Resource Strain:   . Difficulty of Paying Living Expenses:   Food Insecurity:   . Worried About Charity fundraiser in the Last Year:   . Arboriculturist in the Last Year:   Transportation Needs:   . Film/video editor (Medical):   [name] Lack of Transportation (Non-Medical):   Physical Activity:   . Days of Exercise per Week:   . Minutes of Exercise per Session:   Stress:   . Feeling of Stress :   Social Connections:   . Frequency of Communication with Friends and Family:   . Frequency of Social Gatherings with Friends and Family:   . Attends Religious Services:   . Active Member of Clubs or Organizations:   . Attends Archivist Meetings:   [name] Marital Status:   Intimate Partner Violence:   . Fear of Current or Ex-Partner:   . Emotionally Abused:   [name] Physically Abused:   . Sexually Abused:     Past Surgical History:  Procedure Laterality Date  . ABLATION    . BUNIONECTOMY  right  . TOOTH EXTRACTION    . TUBAL LIGATION      Family History  Problem Relation Age of Onset  . Hypertension Mother   . Diabetes Mother   . Heart disease Father   . Diabetes Sister   . Diabetes Brother   . Breast cancer Paternal Aunt 80  . Cancer Maternal Grandmother   . Kidney disease Paternal Grandfather   . Colon cancer Neg Hx   . Esophageal cancer Neg Hx   . Rectal cancer Neg Hx   . Stomach cancer Neg Hx     Allergies  Allergen Reactions  . Actifed Cold-Allergy [Chlorpheniramine-Phenyleph Er]   . Pseudoephedrine Other (See Comments)    "takes me out of my skin"    Current  Outpatient Medications on File Prior to Visit  Medication Sig Dispense Refill  . cholecalciferol (VITAMIN D) 1000 units tablet Take 1,000 Units by mouth daily.    [name] conjugated estrogens (PREMARIN) vaginal cream Place 0.5 g vaginally. Twice a week    . ibuprofen (ADVIL) 800 MG tablet Take 800 mg by mouth 3 (three) times daily.    . methylPREDNISolone (MEDROL) 4 MG tablet Take 4 mg by mouth as directed.    . simvastatin (ZOCOR) 20 MG tablet Take 1 tablet by mouth every evening for cholesterol.  Bridge refill until can be seen by PCM 90 tablet 0   No current facility-administered medications on file prior to visit.    BP 122/74   Pulse 69   Temp (!) 96.6 F (35.9 C) (Temporal)   Ht 5' 3.5" (1.613 m)   Wt 175 lb 8 oz (79.6 kg)   SpO2 98%   BMI 30.60 kg/m    Objective:   Physical Exam  Constitutional: She is oriented to person, place, and time. She appears well-nourished.  HENT:  Right Ear: Tympanic membrane and ear canal normal.  Left Ear: Tympanic membrane and ear canal normal.  Mouth/Throat: Oropharynx is clear and moist.  Eyes: Pupils are equal, round, and reactive to light. EOM are normal.  Cardiovascular: Normal rate and regular rhythm.  Respiratory: Effort normal and breath sounds normal.  GI: Soft. Bowel sounds are normal. There is no abdominal tenderness.  Musculoskeletal:        General: Normal range of motion.     Cervical back: Neck supple.  Neurological: She is alert and oriented to person, place, and time. No cranial nerve deficit.  Reflex Scores:      Patellar reflexes are 2+ on the right side and 2+ on the left side. Skin: Skin is warm and dry.  Psychiatric: She has a normal mood and affect.           Assessment & Plan:

## 2019-05-10 NOTE — Assessment & Plan Note (Signed)
Compliant to simvastatin, repeat lipids pending.  °

## 2019-07-09 ENCOUNTER — Telehealth: Payer: Self-pay | Admitting: Registered Nurse

## 2019-07-09 ENCOUNTER — Encounter: Payer: Self-pay | Admitting: Registered Nurse

## 2019-07-09 NOTE — Telephone Encounter (Signed)
Patient requested refill on ibuprofen 800mg  po daily prn moderate pain #90 RF0 and simvastatin 20mg  po daily #90 RF0 [place] refill as retiring dispensed from [facility] to patient today.  Patient will follow up with PCM to have Rx sent to her civilian pharmacy of choice for next fill as will no longer be eligible to utilize [facility] once retired.  Patient verbalized understanding information/instructions, agreed with plan of care and had no further questions at this time.

## 2019-12-17 ENCOUNTER — Other Ambulatory Visit: Payer: Self-pay | Admitting: Primary Care

## 2019-12-17 DIAGNOSIS — Z1231 Encounter for screening mammogram for malignant neoplasm of breast: Secondary | ICD-10-CM

## 2020-01-17 ENCOUNTER — Other Ambulatory Visit: Payer: Self-pay

## 2020-01-17 DIAGNOSIS — E782 Mixed hyperlipidemia: Secondary | ICD-10-CM

## 2020-01-17 MED ORDER — SIMVASTATIN 20 MG PO TABS: ORAL_TABLET | ORAL | 1 refills | Status: DC

## 2020-01-17 NOTE — Telephone Encounter (Signed)
Pt called triage needing a refill of simvastatin. Called pt back to see what pharmacy she wanted. I have sent it in to [facility] as requested. I advised her she is due for CPE the end of March.

## 2020-01-23 ENCOUNTER — Inpatient Hospital Stay
Admission: RE | Admit: 2020-01-23 | Discharge: 2020-01-23 | Disposition: A | Discharge status: Home / Self Care | Payer: Self-pay | Source: Ambulatory Visit | Attending: *Deleted | Admitting: *Deleted

## 2020-01-23 ENCOUNTER — Other Ambulatory Visit: Payer: Self-pay

## 2020-01-23 ENCOUNTER — Ambulatory Visit
Admission: RE | Admit: 2020-01-23 | Discharge: 2020-01-23 | Disposition: A | Discharge status: Home / Self Care | Payer: Self-pay | Source: Ambulatory Visit | Attending: Primary Care | Admitting: Primary Care

## 2020-01-23 ENCOUNTER — Other Ambulatory Visit: Payer: Self-pay | Admitting: *Deleted

## 2020-01-23 DIAGNOSIS — Z1231 Encounter for screening mammogram for malignant neoplasm of breast: Secondary | ICD-10-CM

## 2020-07-20 ENCOUNTER — Other Ambulatory Visit: Payer: Self-pay | Admitting: Primary Care

## 2020-07-20 DIAGNOSIS — E782 Mixed hyperlipidemia: Secondary | ICD-10-CM

## 2020-07-20 NOTE — Telephone Encounter (Signed)
Follow up has been made and patient does not need refill until she is seen. No further action needed at this time

## 2020-07-20 NOTE — Telephone Encounter (Signed)
Left message to return call to our office.  Need to make f/u has not been seen in office last visit 3/21.

## 2020-08-05 ENCOUNTER — Ambulatory Visit (INDEPENDENT_AMBULATORY_CARE_PROVIDER_SITE_OTHER): Payer: Self-pay | Admitting: Primary Care

## 2020-08-05 ENCOUNTER — Other Ambulatory Visit: Payer: Self-pay

## 2020-08-05 VITALS — BP 140/80 | HR 57 | Temp 98.0°F | Ht 64.0 in | Wt 177.8 lb

## 2020-08-05 DIAGNOSIS — R7303 Prediabetes: Secondary | ICD-10-CM

## 2020-08-05 DIAGNOSIS — E782 Mixed hyperlipidemia: Secondary | ICD-10-CM

## 2020-08-05 MED ORDER — SIMVASTATIN 20 MG PO TABS: ORAL_TABLET | ORAL | 3 refills | Status: DC

## 2020-08-05 NOTE — Assessment & Plan Note (Signed)
Compliant to simvastatin 20 mg, refills provided. Repeat lipid panel pending. She has been stable on this regimen for years.

## 2020-08-05 NOTE — Progress Notes (Signed)
Subjective:    Patient ID: Valerie Sparks, female    DOB: 25-Mar-1955, 65 y.o.   MRN: [id number]  HPI  Valerie Sparks is a very pleasant 65 y.o. female who presents today for follow up and medication refills. She declines CPE.  She is compliant to simvastatin 20 mg, is needing refills. Is due for repeat lipid panel.   She does not check her BP at home. She denies headaches, dizziness, visual changes.   BP Readings from Last 3 Encounters:  08/05/20 140/80  05/10/19 122/74  12/03/18 122/82        Review of Systems  Eyes:  Negative for visual disturbance.  Respiratory:  Negative for shortness of breath.   Cardiovascular:  Negative for chest pain.  Neurological:  Negative for dizziness and headaches.        Past Medical History:  Diagnosis Date   Allergy    Frequent headaches    Hyperlipidemia     Social History   Socioeconomic History   Marital status: Married    Spouse name: Not on file   Number of children: Not on file   Years of education: Not on file   Highest education level: Not on file  Occupational History   Not on file  Tobacco Use   Smoking status: Never   Smokeless tobacco: Never  Substance and Sexual Activity   Alcohol use: Yes    Comment: very rarely   Drug use: No   Sexual activity: Yes  Other Topics Concern   Not on file  Social History Narrative   Not on file   Social Determinants of Health   Financial Resource Strain: Not on file  Food Insecurity: Not on file  Transportation Needs: Not on file  Physical Activity: Not on file  Stress: Not on file  Social Connections: Not on file  Intimate Partner Violence: Not on file    Past Surgical History:  Procedure Laterality Date   ABLATION     BUNIONECTOMY     right   TOOTH EXTRACTION     TUBAL LIGATION      Family History  Problem Relation Age of Onset   Hypertension Mother    Diabetes Mother    Heart disease Father    Diabetes Sister    Diabetes Brother    Breast  cancer Paternal Aunt 44   Cancer Maternal Grandmother    Kidney disease Paternal Grandfather    Colon cancer Neg Hx    Esophageal cancer Neg Hx    Rectal cancer Neg Hx    Stomach cancer Neg Hx     Allergies  Allergen Reactions   Actifed Cold-Allergy [Chlorpheniramine-Phenyleph Er]    Pseudoephedrine Other (See Comments)    "takes me out of my skin"    Current Outpatient Medications on File Prior to Visit  Medication Sig Dispense Refill   cholecalciferol (VITAMIN D) 1000 units tablet Take 1,000 Units by mouth daily.     conjugated estrogens (PREMARIN) vaginal cream Place 0.5 g vaginally. Twice a week     ibuprofen (ADVIL) 800 MG tablet Take 1 tablet (800 mg total) by mouth daily as needed for moderate pain. 90 tablet 0   simvastatin (ZOCOR) 20 MG tablet Take 1 tablet by mouth every evening for cholesterol. 90 tablet 1   No current facility-administered medications on file prior to visit.    BP 140/80   Pulse (!) 57   Temp 98 F (36.7 C) (Temporal)   Ht 5\' 4"  (  1.626 m)   Wt 177 lb 12.8 oz (80.6 kg)   SpO2 97%   BMI 30.52 kg/m  Objective:   Physical Exam Cardiovascular:     Rate and Rhythm: Normal rate and regular rhythm.  Pulmonary:     Effort: Pulmonary effort is normal.     Breath sounds: Normal breath sounds.  Musculoskeletal:     Cervical back: Neck supple.  Skin:    General: Skin is warm and dry.          Assessment & Plan:      This visit occurred during the SARS-CoV-2 public health emergency.  Safety protocols were in place, including screening questions prior to the visit, additional usage of staff PPE, and extensive cleaning of exam room while observing appropriate contact time as indicated for disinfecting solutions.

## 2020-08-05 NOTE — Assessment & Plan Note (Signed)
Discussed the importance of a healthy diet and regular exercise in order for weight loss, and to reduce the risk of further co-morbidity. ? ?Repeat A1C pending. ?

## 2020-08-05 NOTE — Patient Instructions (Signed)
Stop by the lab prior to leaving today. I will notify you of your results once received.   It was a pleasure to see you today!  

## 2020-08-06 LAB — COMPREHENSIVE METABOLIC PANEL
ALT: 13 U/L (ref 0–35)
AST: 22 U/L (ref 0–37)
Albumin: 4.5 g/dL (ref 3.5–5.2)
Alkaline Phosphatase: 63 U/L (ref 39–117)
BUN: 18 mg/dL (ref 6–23)
CO2: 25 mEq/L (ref 19–32)
Calcium: 9.8 mg/dL (ref 8.4–10.5)
Chloride: 104 mEq/L (ref 96–112)
Creatinine, Ser: 0.79 mg/dL (ref 0.40–1.20)
GFR: 79.1 mL/min (ref >60.00)
Glucose, Bld: 89 mg/dL (ref 70–99)
Potassium: 3.9 mEq/L (ref 3.5–5.1)
Sodium: 138 mEq/L (ref 135–145)
Total Bilirubin: 0.4 mg/dL (ref 0.2–1.2)
Total Protein: 7.3 g/dL (ref 6.0–8.3)

## 2020-08-06 LAB — LIPID PANEL
Cholesterol: 188 mg/dL (ref 0–200)
HDL: 45.7 mg/dL (ref >39.00)
LDL Cholesterol: 115 mg/dL — ABNORMAL HIGH (ref 0–99)
NonHDL: 142.23
Total CHOL/HDL Ratio: 4
Triglycerides: 138 mg/dL (ref 0.0–149.0)
VLDL: 27.6 mg/dL (ref 0.0–40.0)

## 2020-08-06 LAB — HEMOGLOBIN A1C: Hgb A1c MFr Bld: 6.1 % (ref 4.6–6.5)

## 2021-02-17 ENCOUNTER — Other Ambulatory Visit: Payer: Self-pay | Admitting: Primary Care

## 2021-02-17 DIAGNOSIS — Z1231 Encounter for screening mammogram for malignant neoplasm of breast: Secondary | ICD-10-CM

## 2021-02-26 ENCOUNTER — Ambulatory Visit
Admission: RE | Admit: 2021-02-26 | Discharge: 2021-02-26 | Disposition: A | Discharge status: Home / Self Care | Payer: Self-pay | Source: Ambulatory Visit | Attending: Primary Care | Admitting: Primary Care

## 2021-02-26 ENCOUNTER — Other Ambulatory Visit: Payer: Self-pay

## 2021-02-26 DIAGNOSIS — Z1231 Encounter for screening mammogram for malignant neoplasm of breast: Secondary | ICD-10-CM | POA: Insufficient documentation

## 2022-02-14 DIAGNOSIS — I4891 Unspecified atrial fibrillation: Secondary | ICD-10-CM

## 2022-02-14 HISTORY — DX: Unspecified atrial fibrillation: I48.91

## 2022-02-23 ENCOUNTER — Other Ambulatory Visit: Payer: Self-pay | Admitting: Primary Care

## 2022-02-23 DIAGNOSIS — Z1231 Encounter for screening mammogram for malignant neoplasm of breast: Secondary | ICD-10-CM

## 2022-03-09 ENCOUNTER — Ambulatory Visit (INDEPENDENT_AMBULATORY_CARE_PROVIDER_SITE_OTHER): Payer: Medicare HMO | Admitting: Family Medicine

## 2022-03-09 ENCOUNTER — Encounter: Payer: Self-pay | Admitting: Family Medicine

## 2022-03-09 VITALS — BP 120/80 | HR 77 | Temp 100.0°F | Ht 62.5 in | Wt 176.4 lb

## 2022-03-09 DIAGNOSIS — M25562 Pain in left knee: Secondary | ICD-10-CM | POA: Diagnosis not present

## 2022-03-09 NOTE — Progress Notes (Unsigned)
    Valerie Dorion T. Lewis Keats, MD, [place] at [facility] [place], [address]  Phone: [phone]  FAX: [place] - 67 y.o. adult  MRN [id number]  Date of Birth: April 25, 1955  Date: 03/09/2022  PCP: Pleas Koch, NP  Referral: Pleas Koch, NP  Chief Complaint  Patient presents with   Knee Pain    Left   Subjective:   Valerie Sparks is a 67 y.o. very pleasant adult patient with Body mass index is 31.75 kg/m. who presents with the following:  Took sister to the beach on Monday, drove and came home.  Stopped to walk the dog, and then got back into the car, and knee has been hurting a lot.   Fot home and knee felt like it was separating.     Review of Systems is noted in the HPI, as appropriate  Objective:   BP 120/80   Pulse 77   Temp 100 F (37.8 C) (Oral)   Ht 5' 2.5" (1.588 m)   Wt 176 lb 6 oz (80 kg)   SpO2 97%   BMI 31.75 kg/m   GEN: No acute distress; alert,appropriate. PULM: Breathing comfortably in no respiratory distress PSYCH: Normally interactive.   Laboratory and Imaging Data:  Assessment and Plan:   ***

## 2022-03-15 ENCOUNTER — Ambulatory Visit
Admission: RE | Admit: 2022-03-15 | Discharge: 2022-03-15 | Disposition: A | Discharge status: Home / Self Care | Payer: Medicare HMO | Source: Ambulatory Visit | Attending: Primary Care | Admitting: Primary Care

## 2022-03-15 DIAGNOSIS — Z1231 Encounter for screening mammogram for malignant neoplasm of breast: Secondary | ICD-10-CM | POA: Insufficient documentation

## 2022-06-06 ENCOUNTER — Telehealth: Payer: Self-pay | Admitting: Primary Care

## 2022-06-06 ENCOUNTER — Ambulatory Visit: Payer: Medicare HMO | Attending: Internal Medicine

## 2022-06-06 ENCOUNTER — Ambulatory Visit (INDEPENDENT_AMBULATORY_CARE_PROVIDER_SITE_OTHER): Payer: Medicare HMO | Admitting: Internal Medicine

## 2022-06-06 ENCOUNTER — Encounter: Payer: Self-pay | Admitting: Internal Medicine

## 2022-06-06 VITALS — BP 122/74 | HR 66 | Temp 97.7°F | Ht 62.5 in | Wt 178.0 lb

## 2022-06-06 DIAGNOSIS — I498 Other specified cardiac arrhythmias: Secondary | ICD-10-CM | POA: Diagnosis not present

## 2022-06-06 DIAGNOSIS — I480 Paroxysmal atrial fibrillation: Secondary | ICD-10-CM | POA: Insufficient documentation

## 2022-06-06 DIAGNOSIS — I499 Cardiac arrhythmia, unspecified: Secondary | ICD-10-CM | POA: Diagnosis not present

## 2022-06-06 LAB — TSH: TSH: 1.22 u[IU]/mL (ref 0.35–5.50)

## 2022-06-06 LAB — COMPREHENSIVE METABOLIC PANEL
ALT: 16 U/L (ref 0–35)
AST: 23 U/L (ref 0–37)
Albumin: 4.3 g/dL (ref 3.5–5.2)
Alkaline Phosphatase: 48 U/L (ref 39–117)
BUN: 15 mg/dL (ref 6–23)
CO2: 26 mEq/L (ref 19–32)
Calcium: 9.6 mg/dL (ref 8.4–10.5)
Chloride: 102 mEq/L (ref 96–112)
Creatinine, Ser: 0.76 mg/dL (ref 0.40–1.20)
GFR: 81.8 mL/min (ref >60.00)
Glucose, Bld: 105 mg/dL — ABNORMAL HIGH (ref 70–99)
Potassium: 3.9 mEq/L (ref 3.5–5.1)
Sodium: 138 mEq/L (ref 135–145)
Total Bilirubin: 0.6 mg/dL (ref 0.2–1.2)
Total Protein: 7.1 g/dL (ref 6.0–8.3)

## 2022-06-06 LAB — LIPID PANEL
Cholesterol: 256 mg/dL — ABNORMAL HIGH (ref 0–200)
HDL: 38.5 mg/dL — ABNORMAL LOW (ref >39.00)
LDL Cholesterol: 180 mg/dL — ABNORMAL HIGH (ref 0–99)
NonHDL: 217.27
Total CHOL/HDL Ratio: 7
Triglycerides: 185 mg/dL — ABNORMAL HIGH (ref 0.0–149.0)
VLDL: 37 mg/dL (ref 0.0–40.0)

## 2022-06-06 LAB — CBC
HCT: 44.9 % (ref 36.0–46.0)
Hemoglobin: 14.9 g/dL (ref 12.0–15.0)
MCHC: 33.2 g/dL (ref 30.0–36.0)
MCV: 82.9 fl (ref 78.0–100.0)
Platelets: 271 10*3/uL (ref 150.0–400.0)
RBC: 5.41 Mil/uL — ABNORMAL HIGH (ref 3.87–5.11)
RDW: 14.2 % (ref 11.5–15.5)
WBC: 4.6 10*3/uL (ref 4.0–10.5)

## 2022-06-06 NOTE — Telephone Encounter (Signed)
Okay Will assess at visit today If all normal, will order Zio monitor

## 2022-06-06 NOTE — Progress Notes (Signed)
Subjective:    Patient ID: Valerie Sparks, adult    DOB: 1955-06-10, 67 y.o.   MRN: [id number]  HPI Here due to concerns about her heart rhythm Her iWatch noticed irregular heartbeats----atrial fibrillation  Happened when she was completely still First time just after getting it in February Now happening more often Woke her 3 times with alarm Did not go off the one time she had it on when walking  Got scared upon being awoken--but no clear palpitation No chest pain  Notes some DOE---relates to being some out of shape No dizziness or syncope  Current Outpatient Medications on File Prior to Visit  Medication Sig Dispense Refill   cholecalciferol (VITAMIN D) 1000 units tablet Take 1,000 Units by mouth daily.     ibuprofen (ADVIL) 800 MG tablet Take 1 tablet (800 mg total) by mouth daily as needed for moderate pain. 90 tablet 0   simvastatin (ZOCOR) 20 MG tablet Take 1 tablet by mouth every evening for cholesterol. (Patient not taking: Reported on 03/09/2022) 90 tablet 3   No current facility-administered medications on file prior to visit.    Allergies  Allergen Reactions   Actifed Cold-Allergy [Chlorpheniramine-Phenyleph Er]    Pseudoephedrine Other (See Comments)    "takes me out of my skin"    Past Medical History:  Diagnosis Date   Allergy    Frequent headaches    Hyperlipidemia     Past Surgical History:  Procedure Laterality Date   ABLATION     BUNIONECTOMY     right   TOOTH EXTRACTION     TUBAL LIGATION      Family History  Problem Relation Age of Onset   Hypertension Mother    Diabetes Mother    Heart disease Father    Diabetes Sister    Diabetes Brother    Breast cancer Paternal Aunt 48   Cancer Maternal Grandmother    Kidney disease Paternal Grandfather    Colon cancer Neg Hx    Esophageal cancer Neg Hx    Rectal cancer Neg Hx    Stomach cancer Neg Hx     Social History   Socioeconomic History   Marital status: Married    Spouse name:  Not on file   Number of children: Not on file   Years of education: Not on file   Highest education level: Not on file  Occupational History   Not on file  Tobacco Use   Smoking status: Never   Smokeless tobacco: Never  Substance and Sexual Activity   Alcohol use: Yes    Comment: very rarely   Drug use: No   Sexual activity: Yes  Other Topics Concern   Not on file  Social History Narrative   Not on file   Social Determinants of Health   Financial Resource Strain: Not on file  Food Insecurity: Not on file  Transportation Needs: Not on file  Physical Activity: Not on file  Stress: Not on file  Social Connections: Not on file  Intimate Partner Violence: Not on file   Review of Systems Trouble initiating sleep--does well once she gets to sleep Appetite is fine Weight up slightly Very little caffeine Stopped statin a while ago---pain has improved     Objective:   Physical Exam Constitutional:      Appearance: Normal appearance.  Cardiovascular:     Rate and Rhythm: Normal rate and regular rhythm.     Pulses: Normal pulses.     Heart sounds:  No murmur heard.    No gallop.  Pulmonary:     Effort: Pulmonary effort is normal.     Breath sounds: Normal breath sounds. No wheezing or rales.  Abdominal:     Palpations: Abdomen is soft.     Tenderness: There is no abdominal tenderness.  Musculoskeletal:     Cervical back: Neck supple.     Right lower leg: No edema.     Left lower leg: No edema.  Lymphadenopathy:     Cervical: No cervical adenopathy.  Neurological:     Mental Status: She is alert.  Psychiatric:        Mood and Affect: Mood normal.        Behavior: Behavior normal.            Assessment & Plan:

## 2022-06-06 NOTE — Telephone Encounter (Signed)
[facility] Day - Client TELEPHONE ADVICE RECORD AccessNurse Patient Name: Valerie Sparks Gender: Female DOB: 03/03/1955 Age: 67 Y 2 M 2 D Return Phone Number: [phone] (Primary), [phone] (Secondary) Address: [place] [address] Client [facility] Day - Client Client Site [facility] - Day Provider Vernona Rieger - NP Contact Type Call Who Is Calling Patient / Member / Family / Caregiver Call Type Triage / Clinical Relationship To Patient Self Return Phone Number [phone] (Primary) Chief Complaint Heart palpitations or irregular heartbeat Reason for Call Symptomatic / Request for Health Information Initial Comment Caller states she been getting notices from her apple watch saying she had a rapid heart beat. Translation No No Triage Reason Other Nurse Assessment Nurse: [name], RN, Lesa Date/Time [name] Time): 06/06/2022 8:26:42 AM Confirm and document reason for call. If symptomatic, describe symptoms. ---Caller states her heart rate today is 68. Her watch was telling her she was in A fib on 06/04/22. She has an appt today at 11:30 am. Advised caller her heart rate is normal. Verbalizes understanding. Does the patient have any new or worsening symptoms? ---Yes Will a triage be completed? ---No Select reason for no triage. ---Other Disp. Time [name] Time) Disposition Final User 06/06/2022 8:30:58 AM Clinical Call Yes [name], RN, [name] Final Disposition 06/06/2022 8:30:58 AM Clinical Call Yes [name], RN, [name]

## 2022-06-06 NOTE — Assessment & Plan Note (Signed)
On her iWatch Occurs at rest always---so I suspect PACs EKG shows sinus at 63, normal axis, possible LAE, non diagnostic RSR in V1---overall a likely normal variant. Compared to 2010, there is basically no change  Will check labs Zio monitor

## 2022-06-06 NOTE — Telephone Encounter (Signed)
Pt already has appt to see Dr Alphonsus Sias 06/06/22 at 11:30 AM. Sending note to Dr Alphonsus Sias.

## 2022-06-06 NOTE — Telephone Encounter (Signed)
Pt called in stating her apple watch has been telling her she has a rapid heart beat while tracking her metrics. Scheduled pt with [facility] today, 4/22 @ 11:30am. Transferred to access nurse. Call back # [phone]

## 2022-06-08 DIAGNOSIS — I499 Cardiac arrhythmia, unspecified: Secondary | ICD-10-CM

## 2022-06-28 ENCOUNTER — Other Ambulatory Visit: Payer: Self-pay | Admitting: *Deleted

## 2022-06-28 ENCOUNTER — Telehealth: Payer: Self-pay | Admitting: *Deleted

## 2022-06-28 ENCOUNTER — Ambulatory Visit: Payer: Medicare HMO | Admitting: Cardiovascular Disease

## 2022-06-28 DIAGNOSIS — I498 Other specified cardiac arrhythmias: Secondary | ICD-10-CM | POA: Diagnosis not present

## 2022-06-28 DIAGNOSIS — I4891 Unspecified atrial fibrillation: Secondary | ICD-10-CM

## 2022-06-28 DIAGNOSIS — I499 Cardiac arrhythmia, unspecified: Secondary | ICD-10-CM | POA: Diagnosis not present

## 2022-06-28 NOTE — Progress Notes (Signed)
error 

## 2022-06-28 NOTE — Telephone Encounter (Signed)
   Cardiac Monitor Alert  Date of alert:  06/28/2022   Patient Name: Valerie Sparks  DOB: 1955/09/08  MRN: [id number]   [facility] Cardiologist: None  [facility] EP:  None    Monitor Information: Cardiac Event Monitor- ZIO  Reason:  Palpitations Ordering provider:  [facility]   Alert Atrial Fibrillation/Flutter This is the 1st alert for this rhythm.  The patient has no hx of Atrial Fibrillation/Flutter.  The patient is not currently on anticoagulation.  Next Cardiology Appointment   Date:  Wednesday 06/29/22 @ 2:20 pm  Provider:  Mariah Milling (New Patient)  The patient was contacted today.  She is symptomatic.  She reports the following symptoms:  Intermittent SOB.  Heart monitor was ordered by Dr. Alphonsus Sias. The final report resulted today and was faxed to our office due to MD notification criteria being met for Rapid Atrial fibrillation. The patient was noted to have A-fib (41%) . Monitor reviewed by Dr. Lalla Brothers as per protocol. Per Dr. Lalla Brothers, the patient will need an Urgent Cardiology Referral for new onset atrial fibrillation. I have spoken with the patient regarding her monitor report and risk involved with atrial fibrillation including increased stroke risk. She is agreeable with seeing Cardiology tomorrow for further evaluation and to formulate a plan of care going forward. The patient voices understanding.   Other: N/a  Sherri Rad, RN  06/28/2022 4:18 PM

## 2022-06-29 ENCOUNTER — Encounter: Payer: Self-pay | Admitting: Cardiovascular Disease

## 2022-06-29 ENCOUNTER — Telehealth: Payer: Self-pay | Admitting: Cardiovascular Disease

## 2022-06-29 ENCOUNTER — Ambulatory Visit: Payer: Medicare HMO | Attending: Cardiovascular Disease | Admitting: Cardiovascular Disease

## 2022-06-29 VITALS — BP 150/80 | HR 67 | Ht 62.5 in | Wt 176.0 lb

## 2022-06-29 DIAGNOSIS — R030 Elevated blood-pressure reading, without diagnosis of hypertension: Secondary | ICD-10-CM | POA: Diagnosis not present

## 2022-06-29 DIAGNOSIS — R7303 Prediabetes: Secondary | ICD-10-CM | POA: Diagnosis not present

## 2022-06-29 DIAGNOSIS — I480 Paroxysmal atrial fibrillation: Secondary | ICD-10-CM

## 2022-06-29 DIAGNOSIS — E782 Mixed hyperlipidemia: Secondary | ICD-10-CM

## 2022-06-29 MED ORDER — ROSUVASTATIN CALCIUM 10 MG PO TABS: 10.0000 mg | ORAL_TABLET | Freq: Every day | ORAL | 3 refills | Status: DC

## 2022-06-29 MED ORDER — RIVAROXABAN 20 MG PO TABS: 20.0000 mg | ORAL_TABLET | Freq: Every day | ORAL | 3 refills | Status: DC

## 2022-06-29 MED ORDER — METOPROLOL TARTRATE 50 MG PO TABS: 50.0000 mg | ORAL_TABLET | Freq: Every day | ORAL | 3 refills | Status: DC

## 2022-06-29 NOTE — Progress Notes (Signed)
Cardiology Office Note  Date:  06/29/2022   ID:  Valerie Sparks, DOB 25-Sep-1955, MRN [id number]  PCP:  Doreene Nest, NP   Chief Complaint  Patient presents with   New Patient (Initial Visit)    Patient c/o SOB/fatigue. She states her Smart watch keeps buzzing and telling her she is in A Fib. She states she recently wore a ZIO monitor which showed A Fib.    HPI:  [name] is a 67 year old woman with past medical history of Elevated blood pressure in office Prediabetes Hyperlipidemia,  Who presents by referral from Vernona Rieger for atrial fibrillation  Apple watch started buzzing 2/24, few times in march  Seen by primary care, Zio monitor ordered 41% burden atrial fibrillation average heart rate 107 bpm Symptom triggers to A-fib Data pulled up and reviewed, long stretches of A-fib noted, often asymptomatic  Stopped simvastatin 20 daily, was having hand and arm pain Without medication total chol 256  Chads VAsc 2-3 (possible HTN, possible diabetes)  EKG personally reviewed by myself on todays visit Normal sinus rhythm rate 67 bpm no significant ST-T wave changes  PMH:   has a past medical history of Allergy, Frequent headaches, and Hyperlipidemia.  PSH:    Past Surgical History:  Procedure Laterality Date   ABLATION     BUNIONECTOMY     right   TOOTH EXTRACTION     TUBAL LIGATION      Current Outpatient Medications  Medication Sig Dispense Refill   ibuprofen (ADVIL) 800 MG tablet Take 1 tablet (800 mg total) by mouth daily as needed for moderate pain. 90 tablet 0   metoprolol tartrate (LOPRESSOR) 50 MG tablet Take 1 tablet (50 mg total) by mouth daily. 90 tablet 3   rivaroxaban (XARELTO) 20 MG TABS tablet Take 1 tablet (20 mg total) by mouth daily with supper. 90 tablet 3   rosuvastatin (CRESTOR) 10 MG tablet Take 1 tablet (10 mg total) by mouth daily. 90 tablet 3   No current facility-administered medications for this visit.     Allergies:    Actifed cold-allergy [chlorpheniramine-phenyleph er], Pseudoephedrine, and Simvastatin   Social History:  The patient  reports that she has never smoked. She has never used smokeless tobacco. She reports current alcohol use. She reports that she does not use drugs.   Family History:   family history includes Breast cancer (age of onset: 25) in her paternal aunt; Cancer in her maternal grandmother; Diabetes in her brother, mother, and sister; Heart attack in her paternal grandfather; Heart disease in her father; Hypertension in her mother; Kidney disease in her paternal grandfather.    Review of Systems: Review of Systems  Constitutional: Negative.   HENT: Negative.    Respiratory: Negative.    Cardiovascular: Negative.   Gastrointestinal: Negative.   Musculoskeletal: Negative.   Neurological: Negative.   Psychiatric/Behavioral: Negative.    All other systems reviewed and are negative.    PHYSICAL EXAM: VS:  BP (!) 150/80 (BP Location: Right Arm, Patient Position: Sitting, Cuff Size: Normal)   Pulse 67   Ht 5' 2.5" (1.588 m)   Wt 176 lb (79.8 kg)   SpO2 98%   BMI 31.68 kg/m  , BMI Body mass index is 31.68 kg/m. GEN: Well nourished, well developed, in no acute distress HEENT: normal Neck: no JVD, carotid bruits, or masses Cardiac: RRR; no murmurs, rubs, or gallops,no edema  Respiratory:  clear to auscultation bilaterally, normal work of breathing GI: soft, nontender, nondistended, +  BS MS: no deformity or atrophy Skin: warm and dry, no rash Neuro:  Strength and sensation are intact Psych: euthymic mood, full affect   Recent Labs: 06/06/2022: ALT 16; BUN 15; Creatinine, Ser 0.76; Hemoglobin 14.9; Platelets 271.0; Potassium 3.9; Sodium 138; TSH 1.22    Lipid Panel Lab Results  Component Value Date   CHOL 256 (H) 06/06/2022   HDL 38.50 (L) 06/06/2022   LDLCALC 180 (H) 06/06/2022   TRIG 185.0 (H) 06/06/2022      Wt Readings from Last 3 Encounters:  06/29/22 176 lb  (79.8 kg)  06/06/22 178 lb (80.7 kg)  03/09/22 176 lb 6 oz (80 kg)       ASSESSMENT AND PLAN:  Problem List Items Addressed This Visit       Cardiology Problems   Mixed hyperlipidemia   Relevant Medications   rosuvastatin (CRESTOR) 10 MG tablet   rivaroxaban (XARELTO) 20 MG TABS tablet   metoprolol tartrate (LOPRESSOR) 50 MG tablet     Other   Prediabetes   Other Visit Diagnoses     Paroxysmal atrial fibrillation (HCC)    -  Primary   Relevant Medications   rosuvastatin (CRESTOR) 10 MG tablet   rivaroxaban (XARELTO) 20 MG TABS tablet   metoprolol tartrate (LOPRESSOR) 50 MG tablet   Other Relevant Orders   EKG 12-Lead   ECHOCARDIOGRAM COMPLETE   Elevated blood pressure reading          Paroxysmal atrial fibrillation Zio monitor results discussed in detail We have recommended she start metoprolol succinate 50 mg daily in effort for rate and rhythm control Recommend she continue to use her watch for rhythm monitoring Watch  only seems to alert her when rhythm is out when she is in resting position -Recommend she start Xarelto 20 mg daily If she continues to have continued atrial fibrillation episodes may need to increase metoprolol succinate up to 50 twice daily, Could even consider adding antiarrhythmic such as flecainide Echocardiogram ordered to rule out structural heart disease  Hyperlipidemia Previously with hand and arm pain on simvastatin, recommend she start Crestor 10 mg daily  Elevated blood pressure reading Will start metoprolol succinate as above Recommend she closely monitor blood pressure at home and call us if blood pressure continues to run high  Prediabetes We have encouraged continued exercise, careful diet management     Total encounter time more than 60 minutes  Greater than 50% was spent in counseling and coordination of care with the patient    Signed, Dossie Arbour, M.D., Ph.D. [facility] [place],  [place] [phone]

## 2022-06-29 NOTE — Telephone Encounter (Signed)
Abormal Cardiac results. Caller stated she can't stay on hold long and would like a callback. Reference number [id number] Please advise

## 2022-06-29 NOTE — Patient Instructions (Addendum)
Medication Instructions:  Crestor 10 mg daily for cholesterol Please start xarelto 20 mg daily Please start metoprolol succinate 50 mg daily, to prevent afib  If you need a refill on your cardiac medications before your next appointment, please call your pharmacy.   Lab work: No new labs needed  Testing/Procedures: Your physician has requested that you have an echocardiogram. Echocardiography is a painless test that uses sound waves to create images of your heart. It provides your doctor with information about the size and shape of your heart and how well your heart's chambers and valves are working. This procedure takes approximately one hour. There are no restrictions for this procedure. Please do NOT wear cologne, perfume, aftershave, or lotions (deodorant is allowed). Please arrive 15 minutes prior to your appointment time.   Follow-Up: At [facility], you and your health needs are our priority.  As part of our continuing mission to provide you with exceptional heart care, we have created designated Provider Care Teams.  These Care Teams include your primary Cardiologist (physician) and Advanced Practice Providers (APPs -  Physician Assistants and Nurse Practitioners) who all work together to provide you with the care you need, when you need it.  You will need a follow up appointment in 3 months  Providers on your designated Care Team:   Nicolasa Ducking, NP Eula Listen, PA-C Cadence Fransico [place], [place]  COVID-19 Vaccine Information can be found at: [email or web address] For questions related to vaccine distribution or appointments, please email [email or web address] or call [phone].

## 2022-06-29 NOTE — Telephone Encounter (Signed)
Spoke with Valerie Sparks from [facility] who called to report 41 % Afib burden during the time pt wore monitor. Valerie Sparks informed we received alert yesterday and MD previously made aware. Pt has an appointment scheduled for today 5/15.

## 2022-08-10 ENCOUNTER — Ambulatory Visit (INDEPENDENT_AMBULATORY_CARE_PROVIDER_SITE_OTHER): Payer: Medicare HMO

## 2022-08-10 VITALS — Ht 62.0 in | Wt 176.0 lb

## 2022-08-10 DIAGNOSIS — Z0000 Encounter for general adult medical examination without abnormal findings: Secondary | ICD-10-CM | POA: Diagnosis not present

## 2022-08-10 NOTE — Progress Notes (Signed)
Subjective:   Valerie Sparks is a 67 y.o. female who presents for an Initial Medicare Annual Wellness Visit.  Visit Complete: Virtual  I connected with  Valerie Sparks on 08/10/22 by a audio enabled telemedicine application and verified that I am speaking with the correct person using two identifiers.  Patient Location: Home  Provider Location: Home Office  I discussed the limitations of evaluation and management by telemedicine. The patient expressed understanding and agreed to proceed.   Review of Systems      Cardiac Risk Factors include: advanced age (>19men, >54 women);dyslipidemia;sedentary lifestyle     Objective:    Today's Vitals   08/10/22 0917  Weight: 176 lb (79.8 kg)  Height: 5\' 2"  (1.575 m)   Body mass index is 32.19 kg/m.     08/10/2022    9:24 AM  Advanced Directives  Does Patient Have a Medical Advance Directive? Yes  Type of Estate agent of [place];Living will  Copy of Healthcare Power of Attorney in Chart? No - copy requested    Current Medications (verified) Outpatient Encounter Medications as of 08/10/2022  Medication Sig   ibuprofen (ADVIL) 800 MG tablet Take 1 tablet (800 mg total) by mouth daily as needed for moderate pain.   metoprolol tartrate (LOPRESSOR) 50 MG tablet Take 1 tablet (50 mg total) by mouth daily.   rivaroxaban (XARELTO) 20 MG TABS tablet Take 1 tablet (20 mg total) by mouth daily with supper.   rosuvastatin (CRESTOR) 10 MG tablet Take 1 tablet (10 mg total) by mouth daily.   No facility-administered encounter medications on file as of 08/10/2022.    Allergies (verified) Actifed cold-allergy [chlorpheniramine-phenyleph er], Pseudoephedrine, and Simvastatin   History: Past Medical History:  Diagnosis Date   Allergy    Frequent headaches    Hyperlipidemia    Past Surgical History:  Procedure Laterality Date   ABLATION     BUNIONECTOMY     right   TOOTH EXTRACTION     TUBAL LIGATION      Family History  Problem Relation Age of Onset   Hypertension Mother    Diabetes Mother    Heart disease Father    Diabetes Sister    Diabetes Brother    Breast cancer Paternal Aunt 67   Cancer Maternal Grandmother    Heart attack Paternal Grandfather    Kidney disease Paternal Grandfather    Colon cancer Neg Hx    Esophageal cancer Neg Hx    Rectal cancer Neg Hx    Stomach cancer Neg Hx    Social History   Socioeconomic History   Marital status: Married    Spouse name: Not on file   Number of children: Not on file   Years of education: Not on file   Highest education level: Not on file  Occupational History   Not on file  Tobacco Use   Smoking status: Never   Smokeless tobacco: Never  Vaping Use   Vaping Use: Never used  Substance and Sexual Activity   Alcohol use: Yes    Comment: very rarely   Drug use: No   Sexual activity: Yes  Other Topics Concern   Not on file  Social History Narrative   Not on file   Social Determinants of Health   Financial Resource Strain: Low Risk  (08/10/2022)   Overall Financial Resource Strain (CARDIA)    Difficulty of Paying Living Expenses: Not hard at all  Food Insecurity: No Food Insecurity (08/10/2022)  Hunger Vital Sign    Worried About Running Out of Food in the Last Year: Never true    Ran Out of Food in the Last Year: Never true  Transportation Needs: No Transportation Needs (08/10/2022)   PRAPARE - Administrator, Civil Service (Medical): No    Lack of Transportation (Non-Medical): No  Physical Activity: Insufficiently Active (08/10/2022)   Exercise Vital Sign    Days of Exercise per Week: 2 days    Minutes of Exercise per Session: 30 min  Stress: No Stress Concern Present (08/10/2022)   [employer] of Occupational Health - Occupational Stress Questionnaire    Feeling of Stress : Not at all  Social Connections: Moderately Integrated (08/10/2022)   Social Connection and Isolation Panel [NHANES]     Frequency of Communication with Friends and Family: More than three times a week    Frequency of Social Gatherings with Friends and Family: More than three times a week    Attends Religious Services: 1 to 4 times per year    Active Member of [employer] or Organizations: No    Attends Engineer, structural: Never    Marital Status: Married    Tobacco Counseling Counseling given: Not Answered   Clinical Intake:  Pre-visit preparation completed: Yes  Pain : No/denies pain     BMI - recorded: 32.19 Nutritional Risks: None Diabetes: No  How often do you need to have someone help you when you read instructions, pamphlets, or other written materials from your doctor or pharmacy?: 1 - Never  Interpreter Needed?: No  Information entered by :: C.Chestina Komatsu LPN   Activities of Daily Living    08/10/2022    9:26 AM  In your present state of health, do you have any difficulty performing the following activities:  Hearing? 0  Vision? 0  Difficulty concentrating or making decisions? 0  Walking or climbing stairs? 1  Comment due to AFIB-followed by cardiology  Dressing or bathing? 0  Doing errands, shopping? 0  Preparing Food and eating ? N  Using the Toilet? N  In the past six months, have you accidently leaked urine? N  Do you have problems with loss of bowel control? N  Managing your Medications? N  Managing your Finances? N  Housekeeping or managing your Housekeeping? N    Patient Care Team: Doreene Nest, NP as PCP - General (Internal Medicine)  Indicate any recent Medical Services you may have received from other than [facility] providers in the past year (date may be approximate).     Assessment:   This is a routine wellness examination for Valerie Sparks.  Hearing/Vision screen Hearing Screening - Comments:: No issues Vision Screening - Comments:: Readers - Dr.Groat - Due for appointment, pt will call to schedule.  Dietary issues and exercise activities discussed:      Goals Addressed             This Visit's Progress    Patient Stated       Lose 25 pounds.       Depression Screen    08/10/2022    9:24 AM 03/09/2022    3:30 PM 08/05/2020    3:19 PM  PHQ 2/9 Scores  PHQ - 2 Score 0 0 0    Fall Risk    08/10/2022    9:25 AM 03/09/2022    3:30 PM 08/05/2020    3:19 PM  Fall Risk   Falls in the past year? 0 0 0  Number falls in past yr: 0 0 0  Injury with Fall? 0 0   Risk for fall due to : No Fall Risks No Fall Risks   Follow up Falls prevention discussed;Falls evaluation completed Falls evaluation completed Falls evaluation completed    MEDICARE RISK AT HOME:  Medicare Risk at Home - 08/10/22 0927     Any stairs in or around the home? No    If so, are there any without handrails? No    Home free of Sparks throw rugs in walkways, pet beds, electrical cords, etc? Yes    Adequate lighting in your home to reduce risk of falls? Yes    Life alert? No    Use of a cane, walker or w/c? No    Grab bars in the bathroom? No    Shower chair or bench in shower? No    Elevated toilet seat or a handicapped toilet? No             TIMED UP AND GO:  Was the test performed? No    Cognitive Function:        08/10/2022    9:27 AM  6CIT Screen  What Year? 0 points  What month? 0 points  What time? 0 points  Count back from 20 0 points  Months in reverse 0 points  Repeat phrase 0 points  Total Score 0 points    Immunizations Immunization History  Administered Date(s) Administered   Influenza,inj,Quad PF,6+ Mos 12/14/2015, 12/06/2016, 01/17/2019   Td 02/15/2000   Tdap 05/10/2019    TDAP status: Up to date  Flu Vaccine status: Declined, Education has been provided regarding the importance of this vaccine but patient still declined. Advised may receive this vaccine at local pharmacy or Health Dept. Aware to provide a copy of the vaccination record if obtained from local pharmacy or Health Dept. Verbalized acceptance and  understanding.  Pneumococcal vaccine status: Declined,  Education has been provided regarding the importance of this vaccine but patient still declined. Advised may receive this vaccine at local pharmacy or Health Dept. Aware to provide a copy of the vaccination record if obtained from local pharmacy or Health Dept. Verbalized acceptance and understanding.   Covid-19 vaccine status: Declined, Education has been provided regarding the importance of this vaccine but patient still declined. Advised may receive this vaccine at local pharmacy or Health Dept.or vaccine clinic. Aware to provide a copy of the vaccination record if obtained from local pharmacy or Health Dept. Verbalized acceptance and understanding.  Qualifies for Shingles Vaccine? Yes   Zostavax completed No   Shingrix Completed?: No.    Education has been provided regarding the importance of this vaccine. Patient has been advised to call insurance company to determine out of pocket expense if they have not yet received this vaccine. Advised may also receive vaccine at local pharmacy or Health Dept. Verbalized acceptance and understanding.  Screening Tests Health Maintenance  Topic Date Due   COVID-19 Vaccine (1) Never done   Hepatitis C Screening  Never done   Zoster Vaccines- Shingrix (1 of 2) Never done   Pneumonia Vaccine 78+ Years old (1 of 1 - PCV) Never done   DEXA SCAN  Never done   INFLUENZA VACCINE  09/15/2022   Colonoscopy  04/27/2023   Medicare Annual Wellness (AWV)  08/10/2023   MAMMOGRAM  03/15/2024   DTaP/Tdap/Td (3 - Td or Tdap) 05/09/2029   HPV VACCINES  Aged Out    Health Maintenance  Health  Maintenance Due  Topic Date Due   COVID-19 Vaccine (1) Never done   Hepatitis C Screening  Never done   Zoster Vaccines- Shingrix (1 of 2) Never done   Pneumonia Vaccine 38+ Years old (1 of 1 - PCV) Never done   DEXA SCAN  Never done    Colorectal cancer screening: Type of screening: Colonoscopy. Completed 04/26/13.  Repeat every 10 years  Mammogram status: Completed 03/15/22. Repeat every year  Bone Scan - Will discuss with PCP  Lung Cancer Screening: (Low Dose CT Chest recommended if Age 22-80 years, 20 pack-year currently smoking OR have quit w/in 15years.) does not qualify.   Lung Cancer Screening Referral: n/a  Additional Screening:  Hepatitis C Screening: does qualify; due at next visit.  Vision Screening: Recommended annual ophthalmology exams for early detection of glaucoma and other disorders of the eye. Is the patient up to date with their annual eye exam?  No , pt will call for eye exam. Who is the provider or what is the name of the office in which the patient attends annual eye exams? Dr.Groat  If pt is not established with a provider, would they like to be referred to a provider to establish care? Yes .   Dental Screening: Recommended annual dental exams for proper oral hygiene   Community Resource Referral / Chronic Care Management: CRR required this visit?  No   CCM required this visit?  No     Plan:     I have personally reviewed and noted the following in the patient's chart:   Medical and social history Use of alcohol, tobacco or illicit drugs  Current medications and supplements including opioid prescriptions. Patient is not currently taking opioid prescriptions. Functional ability and status Nutritional status Physical activity Advanced directives List of other physicians Hospitalizations, surgeries, and ER visits in previous 12 months Vitals Screenings to include cognitive, depression, and falls Referrals and appointments  In addition, I have reviewed and discussed with patient certain preventive protocols, quality metrics, and best practice recommendations. A written personalized care plan for preventive services as well as general preventive health recommendations were provided to patient.     Maryan Puls, LPN   [id number]   After Visit Summary:  (MyChart) Due to this being a telephonic visit, the after visit summary with patients personalized plan was offered to patient via MyChart   Nurse Notes: Vaccinations: declines all Influenza vaccine: recommend every Fall Pneumococcal vaccine: recommend once per lifetime Prevnar-20 Shingles vaccine: recommend Shingrix which is 2 doses 2-6 months apart and over 90% effective     Covid-19: recommend 2 doses one month apart with a booster 6 months later

## 2022-08-10 NOTE — Patient Instructions (Signed)
Valerie Sparks , Thank you for taking time to come for your Medicare Wellness Visit. I appreciate your ongoing commitment to your health goals. Please review the following plan we discussed and let me know if I can assist you in the future.   These are the goals we discussed:  Goals      Patient Stated     Lose 25 pounds.        This is a list of the screening recommended for you and due dates:  Health Maintenance  Topic Date Due   COVID-19 Vaccine (1) Never done   Hepatitis C Screening  Never done   Zoster (Shingles) Vaccine (1 of 2) Never done   Pneumonia Vaccine (1 of 1 - PCV) Never done   DEXA scan (bone density measurement)  Never done   Flu Shot  09/15/2022   Colon Cancer Screening  04/27/2023   Medicare Annual Wellness Visit  08/10/2023   Mammogram  03/15/2024   DTaP/Tdap/Td vaccine (3 - Td or Tdap) 05/09/2029   HPV Vaccine  Aged Out    Advanced directives: Please bring a copy of your health care power of attorney and living will to the office to be added to your chart at your convenience.   Conditions/risks identified: Aim for 30 minutes of exercise or brisk walking, 6-8 glasses of water, and 5 servings of fruits and vegetables each day.   Next appointment: Follow up in one year for your annual wellness visit 08/14/23 @ 9:15 telephone call   Preventive Care 66 Years and Older, Female Preventive care refers to lifestyle choices and visits with your health care provider that can promote health and wellness. What does preventive care include? A yearly physical exam. This is also called an annual well check. Dental exams once or twice a year. Routine eye exams. Ask your health care provider how often you should have your eyes checked. Personal lifestyle choices, including: Daily care of your teeth and gums. Regular physical activity. Eating a healthy diet. Avoiding tobacco and drug use. Limiting alcohol use. Practicing safe sex. Taking low-dose aspirin every  day. Taking vitamin and mineral supplements as recommended by your health care provider. What happens during an annual well check? The services and screenings done by your health care provider during your annual well check will depend on your age, overall health, lifestyle risk factors, and family history of disease. Counseling  Your health care provider may ask you questions about your: Alcohol use. Tobacco use. Drug use. Emotional well-being. Home and relationship well-being. Sexual activity. Eating habits. History of falls. Memory and ability to understand (cognition). Work and work Astronomer. Reproductive health. Screening  You may have the following tests or measurements: Height, weight, and BMI. Blood pressure. Lipid and cholesterol levels. These may be checked every 5 years, or more frequently if you are over 25 years old. Skin check. Lung cancer screening. You may have this screening every year starting at age 66 if you have a 30-pack-year history of smoking and currently smoke or have quit within the past 15 years. Fecal occult blood test (FOBT) of the stool. You may have this test every year starting at age 66. Flexible sigmoidoscopy or colonoscopy. You may have a sigmoidoscopy every 5 years or a colonoscopy every 10 years starting at age 66. Hepatitis C blood test. Hepatitis B blood test. Sexually transmitted disease (STD) testing. Diabetes screening. This is done by checking your blood sugar (glucose) after you have not eaten for a while (fasting).  You may have this done every 1-3 years. Bone density scan. This is done to screen for osteoporosis. You may have this done starting at age 66. Mammogram. This may be done every 1-2 years. Talk to your health care provider about how often you should have regular mammograms. Talk with your health care provider about your test results, treatment options, and if necessary, the need for more tests. Vaccines  Your health care  provider may recommend certain vaccines, such as: Influenza vaccine. This is recommended every year. Tetanus, diphtheria, and acellular pertussis (Tdap, Td) vaccine. You may need a Td booster every 10 years. Zoster vaccine. You may need this after age 66. Pneumococcal 13-valent conjugate (PCV13) vaccine. One dose is recommended after age 66. Pneumococcal polysaccharide (PPSV23) vaccine. One dose is recommended after age 66. Talk to your health care provider about which screenings and vaccines you need and how often you need them. This information is not intended to replace advice given to you by your health care provider. Make sure you discuss any questions you have with your health care provider. Document Released: 02/27/2015 Document Revised: 10/21/2015 Document Reviewed: 12/02/2014 Elsevier Interactive Patient Education  2017 [place] Prevention in the Home Falls can cause injuries. They can happen to people of all ages. There are many things you can do to make your home safe and to help prevent falls. What can I do on the outside of my home? Regularly fix the edges of walkways and driveways and fix any cracks. Remove anything that might make you trip as you walk through a door, such as a raised step or threshold. Trim any bushes or trees on the path to your home. Use bright outdoor lighting. Clear any walking paths of anything that might make someone trip, such as rocks or tools. Regularly check to see if handrails are loose or broken. Make sure that both sides of any steps have handrails. Any raised decks and porches should have guardrails on the edges. Have any leaves, snow, or ice cleared regularly. Use sand or salt on walking paths during winter. Clean up any spills in your garage right away. This includes oil or grease spills. What can I do in the bathroom? Use night lights. Install grab bars by the toilet and in the tub and shower. Do not use towel bars as grab  bars. Use non-skid mats or decals in the tub or shower. If you need to sit down in the shower, use a plastic, non-slip stool. Keep the floor dry. Clean up any water that spills on the floor as soon as it happens. Remove soap buildup in the tub or shower regularly. Attach bath mats securely with double-sided non-slip rug tape. Do not have throw rugs and other things on the floor that can make you trip. What can I do in the bedroom? Use night lights. Make sure that you have a light by your bed that is easy to reach. Do not use any sheets or blankets that are too big for your bed. They should not hang down onto the floor. Have a firm chair that has side arms. You can use this for support while you get dressed. Do not have throw rugs and other things on the floor that can make you trip. What can I do in the kitchen? Clean up any spills right away. Avoid walking on wet floors. Keep items that you use a lot in easy-to-reach places. If you need to reach something above you, use a  strong step stool that has a grab bar. Keep electrical cords out of the way. Do not use floor polish or wax that makes floors slippery. If you must use wax, use non-skid floor wax. Do not have throw rugs and other things on the floor that can make you trip. What can I do with my stairs? Do not leave any items on the stairs. Make sure that there are handrails on both sides of the stairs and use them. Fix handrails that are broken or loose. Make sure that handrails are as long as the stairways. Check any carpeting to make sure that it is firmly attached to the stairs. Fix any carpet that is loose or worn. Avoid having throw rugs at the top or bottom of the stairs. If you do have throw rugs, attach them to the floor with carpet tape. Make sure that you have a light switch at the top of the stairs and the bottom of the stairs. If you do not have them, ask someone to add them for you. What else can I do to help prevent  falls? Wear shoes that: Do not have high heels. Have rubber bottoms. Are comfortable and fit you well. Are closed at the toe. Do not wear sandals. If you use a stepladder: Make sure that it is fully opened. Do not climb a closed stepladder. Make sure that both sides of the stepladder are locked into place. Ask someone to hold it for you, if possible. Clearly mark and make sure that you can see: Any grab bars or handrails. First and last steps. Where the edge of each step is. Use tools that help you move around (mobility aids) if they are needed. These include: Canes. Walkers. Scooters. Crutches. Turn on the lights when you go into a dark area. Replace any light bulbs as soon as they burn out. Set up your furniture so you have a clear path. Avoid moving your furniture around. If any of your floors are uneven, fix them. If there are any pets around you, be aware of where they are. Review your medicines with your doctor. Some medicines can make you feel dizzy. This can increase your chance of falling. Ask your doctor what other things that you can do to help prevent falls. This information is not intended to replace advice given to you by your health care provider. Make sure you discuss any questions you have with your health care provider. Document Released: 11/27/2008 Document Revised: 07/09/2015 Document Reviewed: 03/07/2014 Elsevier Interactive Patient Education  2017 [employer].

## 2022-08-24 ENCOUNTER — Ambulatory Visit: Payer: Medicare HMO | Attending: Cardiovascular Disease

## 2022-08-24 DIAGNOSIS — I480 Paroxysmal atrial fibrillation: Secondary | ICD-10-CM | POA: Diagnosis not present

## 2022-08-24 DIAGNOSIS — I083 Combined rheumatic disorders of mitral, aortic and tricuspid valves: Secondary | ICD-10-CM

## 2022-08-24 LAB — ECHOCARDIOGRAM COMPLETE
AR max vel: 2.17 cm2
AV Area VTI: 2.32 cm2
AV Area mean vel: 2.28 cm2
AV Mean grad: 4 mmHg
AV Peak grad: 7.8 mmHg
Ao pk vel: 1.4 m/s
Area-P 1/2: 3.27 cm2
S' Lateral: 3.2 cm
Single Plane A4C EF: 62.7 %

## 2022-08-29 ENCOUNTER — Telehealth: Payer: Self-pay | Admitting: Emergency Medicine

## 2022-08-29 DIAGNOSIS — Z79899 Other long term (current) drug therapy: Secondary | ICD-10-CM

## 2022-08-29 MED ORDER — FUROSEMIDE 20 MG PO TABS: 20.0000 mg | ORAL_TABLET | Freq: Every day | ORAL | 3 refills | Status: DC

## 2022-08-29 MED ORDER — POTASSIUM CHLORIDE CRYS ER 20 MEQ PO TBCR: 20.0000 meq | EXTENDED_RELEASE_TABLET | Freq: Every day | ORAL | 3 refills | Status: DC

## 2022-08-29 NOTE — Telephone Encounter (Signed)
-----   Message from Julien Nordmann sent at 08/28/2022 11:01 AM EDT ----- Echocardiogram Normal left and right ventricular size and function Mildly elevated right heart pressures Moderate mitral valve regurgitation Moderate to severe tricuspid valve regurgitation Would recommend starting Lasix 20 mg daily with potassium 20 daily BMP in 3 weeks time

## 2022-08-29 NOTE — Telephone Encounter (Signed)
Called and spoke with patient regarding the following from Dr. Mariah Milling. Echocardiogram  Normal left and right ventricular size and function  Mildly elevated right heart pressures  Moderate mitral valve regurgitation  Moderate to severe tricuspid valve regurgitation  Would recommend starting Lasix 20 mg daily with potassium 20 daily  BMP in 3 weeks time   Patient verbalizes understanding. Prescriptions sent to patient's preferred pharmacy.

## 2022-08-29 NOTE — Addendum Note (Signed)
Addended by: Jani Gravel on: 08/29/2022 02:23 PM   Modules accepted: Orders

## 2022-09-21 ENCOUNTER — Other Ambulatory Visit
Admission: RE | Admit: 2022-09-21 | Discharge: 2022-09-21 | Disposition: A | Discharge status: Home / Self Care | Payer: Medicare HMO | Attending: Cardiovascular Disease | Admitting: Cardiovascular Disease

## 2022-09-21 DIAGNOSIS — Z79899 Other long term (current) drug therapy: Secondary | ICD-10-CM | POA: Diagnosis not present

## 2022-09-21 LAB — BASIC METABOLIC PANEL
Anion gap: 7 (ref 5–15)
BUN: 15 mg/dL (ref 8–23)
CO2: 24 mmol/L (ref 22–32)
Calcium: 9.1 mg/dL (ref 8.9–10.3)
Chloride: 106 mmol/L (ref 98–111)
Creatinine, Ser: 0.81 mg/dL (ref 0.44–1.00)
GFR, Estimated: >60 mL/min (ref >60)
Glucose, Bld: 125 mg/dL — ABNORMAL HIGH (ref 70–99)
Potassium: 3.9 mmol/L (ref 3.5–5.1)
Sodium: 137 mmol/L (ref 135–145)

## 2022-09-28 NOTE — Progress Notes (Signed)
Cardiology Office Note  Date:  09/30/2022   ID:  Valerie Sparks, DOB 09-05-1955, MRN [id number]  PCP:  Doreene Nest, NP   Chief Complaint  Patient presents with   Follow-up    Patient reports bilateral hand pain/cramps with Crestor.   A-fib episodes are improving.      HPI:  [name] is a 66 year old woman with past medical history of Elevated blood pressure in office Prediabetes Hyperlipidemia,  Elevated right heart pressures, tricuspid valve regurgitation Who presents for follow-up of her atrial fibrillation, hyperlipidemia  Last seen in clinic by myself May 2024  Echocardiogram July 2024 Normal left and right ventricular size and function Mildly elevated right heart pressures Moderate mitral valve regurgitation Moderate to severe tricuspid valve regurgitation  Started on Lasix 20 mg daily with potassium 20 daily  Hand cramps, "from crestor"  Continues to have paroxysmal tachycardia/PAF For unclear reasons was started on metoprolol tartrate rather than Toprol succinate 50 daily as suggested on last clinic visit Having breakthrough arrhythmia overnight  Continues to use her Apple Watch to monitor for heart arrhythmia  EKG personally reviewed by myself on todays visit EKG Interpretation Date/Time:  Friday September 30 2022 10:27:03 EDT Ventricular Rate:  50 PR Interval:  146 QRS Duration:  80 QT Interval:  428 QTC Calculation: 390 R Axis:   16  Text Interpretation: Sinus bradycardia with Premature atrial complexes Confirmed by Julien Nordmann [phone]) on 09/30/2022 10:51:44 AM   Other past medical history reviewed Zio monitor  41% burden atrial fibrillation average heart rate 107 bpm Symptom triggers to A-fib long stretches of A-fib noted, often asymptomatic  Stopped simvastatin 20 daily, was having hand and arm pain Without medication total chol 256  Chads VAsc 2-3 (possible HTN, possible diabetes)    PMH:   has a past medical history of  Allergy, Frequent headaches, and Hyperlipidemia.  PSH:    Past Surgical History:  Procedure Laterality Date   ABLATION     BUNIONECTOMY     right   TOOTH EXTRACTION     TUBAL LIGATION      Current Outpatient Medications  Medication Sig Dispense Refill   furosemide (LASIX) 20 MG tablet Take 1 tablet (20 mg total) by mouth daily. 90 tablet 3   ibuprofen (ADVIL) 800 MG tablet Take 1 tablet (800 mg total) by mouth daily as needed for moderate pain. 90 tablet 0   metoprolol tartrate (LOPRESSOR) 50 MG tablet Take 1 tablet (50 mg total) by mouth daily. 90 tablet 3   potassium chloride SA (KLOR-CON M) 20 MEQ tablet Take 1 tablet (20 mEq total) by mouth daily. 90 tablet 3   rivaroxaban (XARELTO) 20 MG TABS tablet Take 1 tablet (20 mg total) by mouth daily with supper. 90 tablet 3   rosuvastatin (CRESTOR) 10 MG tablet Take 1 tablet (10 mg total) by mouth daily. 90 tablet 3   No current facility-administered medications for this visit.     Allergies:   Actifed cold-allergy [chlorpheniramine-phenyleph er], Pseudoephedrine, and Simvastatin   Social History:  The patient  reports that she has never smoked. She has never used smokeless tobacco. She reports current alcohol use. She reports that she does not use drugs.   Family History:   family history includes Breast cancer (age of onset: 53) in her paternal aunt; Cancer in her maternal grandmother; Diabetes in her brother, mother, and sister; Heart attack in her paternal grandfather; Heart disease in her father; Hypertension in her mother; Kidney disease  in her paternal grandfather.    Review of Systems: Review of Systems  Constitutional: Negative.   HENT: Negative.    Respiratory: Negative.    Cardiovascular: Negative.   Gastrointestinal: Negative.   Musculoskeletal: Negative.   Neurological: Negative.   Psychiatric/Behavioral: Negative.    All other systems reviewed and are negative.   PHYSICAL EXAM: VS:  BP (!) 148/70 (BP Location:  Left Arm, Patient Position: Sitting, Cuff Size: Normal)   Pulse (!) 50   Ht 5\' 2"  (1.575 m)   Wt 176 lb 3.2 oz (79.9 kg)   SpO2 98%   BMI 32.23 kg/m  , BMI Body mass index is 32.23 kg/m. Constitutional:  oriented to person, place, and time. No distress.  HENT:  Head: Grossly normal Eyes:  no discharge. No scleral icterus.  Neck: No JVD, no carotid bruits  Cardiovascular: Regular rate and rhythm, no murmurs appreciated Pulmonary/Chest: Clear to auscultation bilaterally, no wheezes or rails Abdominal: Soft.  no distension.  no tenderness.  Musculoskeletal: Normal range of motion Neurological:  normal muscle tone. Coordination normal. No atrophy Skin: Skin warm and dry Psychiatric: normal affect, pleasant  Recent Labs: 06/06/2022: ALT 16; Hemoglobin 14.9; Platelets 271.0; TSH 1.22 09/21/2022: BUN 15; Creatinine, Ser 0.81; Potassium 3.9; Sodium 137    Lipid Panel Lab Results  Component Value Date   CHOL 256 (H) 06/06/2022   HDL 38.50 (L) 06/06/2022   LDLCALC 180 (H) 06/06/2022   TRIG 185.0 (H) 06/06/2022      Wt Readings from Last 3 Encounters:  09/30/22 176 lb 3.2 oz (79.9 kg)  08/10/22 176 lb (79.8 kg)  06/29/22 176 lb (79.8 kg)     ASSESSMENT AND PLAN:  Problem List Items Addressed This Visit       Cardiology Problems   Mixed hyperlipidemia     Other   Prediabetes   Other Visit Diagnoses     Paroxysmal atrial fibrillation (HCC)    -  Primary   Relevant Orders   EKG 12-Lead (Completed)   Elevated blood pressure reading           Paroxysmal atrial fibrillation Recommended she stop metoprolol tartrate and start metoprolol succinate 50 twice daily -Continue anticoagulation with Xarelto 20  Hyperlipidemia Previously with hand and arm pain on simvastatin,  Similar symptoms on Crestor 10 daily Recommended she decrease Crestor 10 down to every other day Start Zetia 10 mg daily  Elevated blood pressure reading Metoprolol succinate 50 twice  daily  Prediabetes We have encouraged continued exercise, careful diet management   Shortness of breath Significant tricuspid valve regurgitation, elevated right heart pressures on echo Was started on Lasix 20 mg daily Reports continued shortness of breath on exertion Stable BMP, recommend we increase Lasix up to 20 twice daily Recommended she moderate her fluid intake    Total encounter time more than 60 minutes  Greater than 50% was spent in counseling and coordination of care with the patient    Signed, Dossie Arbour, M.D., Ph.D. [facility] [place], [place] [phone]

## 2022-09-30 ENCOUNTER — Encounter: Payer: Self-pay | Admitting: Cardiovascular Disease

## 2022-09-30 ENCOUNTER — Ambulatory Visit: Payer: Medicare HMO | Admitting: Cardiovascular Disease

## 2022-09-30 VITALS — BP 148/70 | HR 50 | Ht 62.0 in | Wt 176.2 lb

## 2022-09-30 DIAGNOSIS — I480 Paroxysmal atrial fibrillation: Secondary | ICD-10-CM

## 2022-09-30 DIAGNOSIS — R7303 Prediabetes: Secondary | ICD-10-CM | POA: Diagnosis not present

## 2022-09-30 DIAGNOSIS — E782 Mixed hyperlipidemia: Secondary | ICD-10-CM

## 2022-09-30 DIAGNOSIS — R030 Elevated blood-pressure reading, without diagnosis of hypertension: Secondary | ICD-10-CM

## 2022-09-30 MED ORDER — FUROSEMIDE 20 MG PO TABS: 20.0000 mg | ORAL_TABLET | Freq: Two times a day (BID) | ORAL | 3 refills | Status: DC

## 2022-09-30 MED ORDER — EZETIMIBE 10 MG PO TABS: 10.0000 mg | ORAL_TABLET | Freq: Every day | ORAL | 3 refills | Status: DC

## 2022-09-30 MED ORDER — ROSUVASTATIN CALCIUM 10 MG PO TABS: 10.0000 mg | ORAL_TABLET | ORAL | 3 refills | Status: DC

## 2022-09-30 MED ORDER — METOPROLOL TARTRATE 50 MG PO TABS: 50.0000 mg | ORAL_TABLET | Freq: Two times a day (BID) | ORAL | Status: DC | PRN

## 2022-09-30 MED ORDER — METOPROLOL SUCCINATE ER 50 MG PO TB24: 50.0000 mg | ORAL_TABLET | Freq: Two times a day (BID) | ORAL | 3 refills | Status: DC

## 2022-09-30 NOTE — Patient Instructions (Addendum)
Medication Instructions:  Hold the metoprolol tartrate, take as needed for afib spells  Start metoprolol succinate 50 mg twice a day  Please increase the lasix up to 20 mg twice a day  Try crestor 10 mg every other day Start zetia 10 mg daily  Monitor blood pressure at home  If you need a refill on your cardiac medications before your next appointment, please call your pharmacy.   Lab work: No new labs needed  Testing/Procedures: No new testing needed  Follow-Up: At [facility], you and your health needs are our priority.  As part of our continuing mission to provide you with exceptional heart care, we have created designated Provider Care Teams.  These Care Teams include your primary Cardiologist (physician) and Advanced Practice Providers (APPs -  Physician Assistants and Nurse Practitioners) who all work together to provide you with the care you need, when you need it.  You will need a follow up appointment in 6 months  Providers on your designated Care Team:   Nicolasa Ducking, NP Eula Listen, PA-C Cadence Fransico Michael, [place]  COVID-19 Vaccine Information can be found at: [email or web address] For questions related to vaccine distribution or appointments, please email [email or web address] or call [phone].

## 2023-02-01 DIAGNOSIS — R7303 Prediabetes: Secondary | ICD-10-CM | POA: Diagnosis not present

## 2023-02-01 DIAGNOSIS — E785 Hyperlipidemia, unspecified: Secondary | ICD-10-CM | POA: Diagnosis not present

## 2023-02-01 DIAGNOSIS — E876 Hypokalemia: Secondary | ICD-10-CM | POA: Diagnosis not present

## 2023-02-01 DIAGNOSIS — I509 Heart failure, unspecified: Secondary | ICD-10-CM | POA: Diagnosis not present

## 2023-02-01 DIAGNOSIS — Z7901 Long term (current) use of anticoagulants: Secondary | ICD-10-CM | POA: Diagnosis not present

## 2023-02-01 DIAGNOSIS — D6869 Other thrombophilia: Secondary | ICD-10-CM | POA: Diagnosis not present

## 2023-02-01 DIAGNOSIS — I4891 Unspecified atrial fibrillation: Secondary | ICD-10-CM | POA: Diagnosis not present

## 2023-02-01 DIAGNOSIS — Z8249 Family history of ischemic heart disease and other diseases of the circulatory system: Secondary | ICD-10-CM | POA: Diagnosis not present

## 2023-02-01 DIAGNOSIS — I110 Hypertensive heart disease with heart failure: Secondary | ICD-10-CM | POA: Diagnosis not present

## 2023-02-15 DIAGNOSIS — M8580 Other specified disorders of bone density and structure, unspecified site: Secondary | ICD-10-CM

## 2023-02-15 HISTORY — DX: Other specified disorders of bone density and structure, unspecified site: M85.80

## 2023-02-20 ENCOUNTER — Other Ambulatory Visit: Payer: Self-pay | Admitting: Primary Care

## 2023-02-20 DIAGNOSIS — Z1231 Encounter for screening mammogram for malignant neoplasm of breast: Secondary | ICD-10-CM

## 2023-03-17 ENCOUNTER — Ambulatory Visit
Admission: RE | Admit: 2023-03-17 | Discharge: 2023-03-17 | Disposition: A | Discharge status: Home / Self Care | Payer: Medicare HMO | Source: Ambulatory Visit | Attending: Primary Care

## 2023-03-17 DIAGNOSIS — Z1231 Encounter for screening mammogram for malignant neoplasm of breast: Secondary | ICD-10-CM | POA: Insufficient documentation

## 2023-04-24 DIAGNOSIS — Z0000 Encounter for general adult medical examination without abnormal findings: Secondary | ICD-10-CM | POA: Diagnosis not present

## 2023-04-24 DIAGNOSIS — Z1331 Encounter for screening for depression: Secondary | ICD-10-CM | POA: Diagnosis not present

## 2023-05-09 ENCOUNTER — Ambulatory Visit (INDEPENDENT_AMBULATORY_CARE_PROVIDER_SITE_OTHER): Payer: Medicare HMO | Admitting: Primary Care

## 2023-05-09 ENCOUNTER — Encounter: Payer: Self-pay | Admitting: Primary Care

## 2023-05-09 VITALS — BP 122/70 | HR 45 | Temp 97.8°F | Ht 62.0 in | Wt 178.0 lb

## 2023-05-09 DIAGNOSIS — R7303 Prediabetes: Secondary | ICD-10-CM | POA: Diagnosis not present

## 2023-05-09 DIAGNOSIS — Z0000 Encounter for general adult medical examination without abnormal findings: Secondary | ICD-10-CM | POA: Diagnosis not present

## 2023-05-09 DIAGNOSIS — E2839 Other primary ovarian failure: Secondary | ICD-10-CM | POA: Diagnosis not present

## 2023-05-09 DIAGNOSIS — I480 Paroxysmal atrial fibrillation: Secondary | ICD-10-CM | POA: Diagnosis not present

## 2023-05-09 DIAGNOSIS — Z1211 Encounter for screening for malignant neoplasm of colon: Secondary | ICD-10-CM

## 2023-05-09 DIAGNOSIS — E782 Mixed hyperlipidemia: Secondary | ICD-10-CM | POA: Diagnosis not present

## 2023-05-09 LAB — LIPID PANEL
Cholesterol: 143 mg/dL (ref 0–200)
HDL: 35.8 mg/dL — ABNORMAL LOW (ref >39.00)
LDL Cholesterol: 78 mg/dL (ref 0–99)
NonHDL: 107.42
Total CHOL/HDL Ratio: 4
Triglycerides: 148 mg/dL (ref 0.0–149.0)
VLDL: 29.6 mg/dL (ref 0.0–40.0)

## 2023-05-09 LAB — COMPREHENSIVE METABOLIC PANEL
ALT: 22 U/L (ref 0–35)
AST: 30 U/L (ref 0–37)
Albumin: 4.2 g/dL (ref 3.5–5.2)
Alkaline Phosphatase: 53 U/L (ref 39–117)
BUN: 15 mg/dL (ref 6–23)
CO2: 29 meq/L (ref 19–32)
Calcium: 9.3 mg/dL (ref 8.4–10.5)
Chloride: 103 meq/L (ref 96–112)
Creatinine, Ser: 0.76 mg/dL (ref 0.40–1.20)
GFR: 81.27 mL/min (ref >60.00)
Glucose, Bld: 103 mg/dL — ABNORMAL HIGH (ref 70–99)
Potassium: 4.2 meq/L (ref 3.5–5.1)
Sodium: 140 meq/L (ref 135–145)
Total Bilirubin: 0.7 mg/dL (ref 0.2–1.2)
Total Protein: 6.4 g/dL (ref 6.0–8.3)

## 2023-05-09 LAB — HEMOGLOBIN A1C: Hgb A1c MFr Bld: 6.3 % (ref 4.6–6.5)

## 2023-05-09 NOTE — Progress Notes (Signed)
 Subjective:    Patient ID: Valerie Sparks, adult    DOB: 08-Jun-1955, 68 y.o.   MRN: [id number]  HPI  Valerie Sparks is a very pleasant 68 y.o. adult who presents today for complete physical and follow up of chronic conditions.  She has not been seen by me since June 2022.   Immunizations: -Tetanus: Completed in 2021 -Shingles: Completed at [facility] -Pneumonia: Never completed, declines   Diet: Fair diet.  Exercise: Regular exercise, walking 3 days weekly   Eye exam: Completes 2 years ago   Dental exam: Completes > 1 year ago   Mammogram: Completed in February 2025 Bone Density Scan: Completed years ago.   Colonoscopy: Completed in 2015, due  BP Readings from Last 3 Encounters:  05/09/23 122/70  09/30/22 (!) 148/70  06/29/22 (!) 150/80        Review of Systems  Constitutional:  Negative for unexpected weight change.  HENT:  Negative for rhinorrhea.   Respiratory:  Negative for cough and shortness of breath.   Cardiovascular:  Positive for palpitations. Negative for chest pain.  Gastrointestinal:  Negative for constipation and diarrhea.  Genitourinary:  Negative for difficulty urinating.  Musculoskeletal:  Negative for arthralgias and myalgias.  Skin:  Negative for rash.  Allergic/Immunologic: Negative for environmental allergies.  Neurological:  Negative for dizziness and headaches.  Psychiatric/Behavioral:  The patient is not nervous/anxious.          Past Medical History:  Diagnosis Date   Allergy    Frequent headaches    Herpes zoster without complication 12/03/2018   Hyperlipidemia     Social History   Socioeconomic History   Marital status: Married    Spouse name: Not on file   Number of children: Not on file   Years of education: Not on file   Highest education level: 12th grade  Occupational History   Not on file  Tobacco Use   Smoking status: Never   Smokeless tobacco: Never  Vaping Use   Vaping status: Never Used  Substance and  Sexual Activity   Alcohol use: Yes    Comment: very rarely   Drug use: No   Sexual activity: Yes  Other Topics Concern   Not on file  Social History Narrative   Not on file   Social Drivers of Health   Financial Resource Strain: Low Risk  (05/08/2023)   Overall Financial Resource Strain (CARDIA)    Difficulty of Paying Living Expenses: Not hard at all  Food Insecurity: No Food Insecurity (05/08/2023)   Hunger Vital Sign    Worried About Running Out of Food in the Last Year: Never true    Ran Out of Food in the Last Year: Never true  Transportation Needs: No Transportation Needs (05/08/2023)   PRAPARE - Administrator, Civil Service (Medical): No    Lack of Transportation (Non-Medical): No  Physical Activity: Insufficiently Active (05/08/2023)   Exercise Vital Sign    Days of Exercise per Week: 3 days    Minutes of Exercise per Session: 30 min  Stress: No Stress Concern Present (05/08/2023)   [employer] of Occupational Health - Occupational Stress Questionnaire    Feeling of Stress : Not at all  Social Connections: Moderately Integrated (05/08/2023)   Social Connection and Isolation Panel [NHANES]    Frequency of Communication with Friends and Family: More than three times a week    Frequency of Social Gatherings with Friends and Family: Twice a week  Attends Religious Services: 1 to 4 times per year    Active Member of Clubs or Organizations: No    Attends Banker Meetings: Never    Marital Status: Married  Catering manager Violence: Not At Risk (08/10/2022)   Humiliation, Afraid, Rape, and Kick questionnaire    Fear of Current or Ex-Partner: No    Emotionally Abused: No    Physically Abused: No    Sexually Abused: No    Past Surgical History:  Procedure Laterality Date   ABLATION     BUNIONECTOMY     right   TOOTH EXTRACTION     TUBAL LIGATION      Family History  Problem Relation Age of Onset   Hypertension Mother    Diabetes  Mother    Heart disease Father    Diabetes Sister    Breast cancer Daughter 36   Breast cancer Paternal Aunt 53   Cancer Maternal Grandmother    Heart attack Paternal Grandfather    Kidney disease Paternal Grandfather    Diabetes Brother    Colon cancer Neg Hx    Esophageal cancer Neg Hx    Rectal cancer Neg Hx    Stomach cancer Neg Hx     Allergies  Allergen Reactions   Actifed Cold-Allergy [Chlorpheniramine-Phenyleph Er]    Pseudoephedrine Other (See Comments)    "takes me out of my skin"   Simvastatin     Joint pain    Current Outpatient Medications on File Prior to Visit  Medication Sig Dispense Refill   ibuprofen (ADVIL) 800 MG tablet Take 1 tablet (800 mg total) by mouth daily as needed for moderate pain. 90 tablet 0   potassium chloride SA (KLOR-CON M) 20 MEQ tablet Take 1 tablet (20 mEq total) by mouth daily. 90 tablet 3   rivaroxaban (XARELTO) 20 MG TABS tablet Take 1 tablet (20 mg total) by mouth daily with supper. 90 tablet 3   ezetimibe (ZETIA) 10 MG tablet Take 1 tablet (10 mg total) by mouth daily. 90 tablet 3   furosemide (LASIX) 20 MG tablet Take 1 tablet (20 mg total) by mouth 2 (two) times daily. 180 tablet 3   metoprolol succinate (TOPROL-XL) 50 MG 24 hr tablet Take 1 tablet (50 mg total) by mouth in the morning and at bedtime. Take with or immediately following a meal. 180 tablet 3   rosuvastatin (CRESTOR) 10 MG tablet Take 1 tablet (10 mg total) by mouth every other day. 45 tablet 3   No current facility-administered medications on file prior to visit.    BP 122/70   Pulse (!) 45   Temp 97.8 F (36.6 C) (Temporal)   Ht 5\' 2"  (1.575 m)   Wt 178 lb (80.7 kg)   SpO2 99%   BMI 32.56 kg/m  Objective:   Physical Exam HENT:     Right Ear: Tympanic membrane and ear canal normal.     Left Ear: Tympanic membrane and ear canal normal.  Eyes:     Pupils: Pupils are equal, round, and reactive to light.  Cardiovascular:     Rate and Rhythm: Normal rate and  regular rhythm.  Pulmonary:     Effort: Pulmonary effort is normal.     Breath sounds: Normal breath sounds.  Abdominal:     General: Bowel sounds are normal.     Palpations: Abdomen is soft.     Tenderness: There is no abdominal tenderness.  Musculoskeletal:  General: Normal range of motion.     Cervical back: Neck supple.  Skin:    General: Skin is warm and dry.  Neurological:     Mental Status: She is alert and oriented to person, place, and time.     Cranial Nerves: No cranial nerve deficit.     Deep Tendon Reflexes:     Reflex Scores:      Patellar reflexes are 2+ on the right side and 2+ on the left side. Psychiatric:        Mood and Affect: Mood normal.           Assessment & Plan:  Routine general medical examination at a health care facility Assessment & Plan: Declines pneumonia vaccine.  Will contact pharmacy for Shingrix vaccines. Mammogram up-to-date. Bone density scan due, orders placed. Colonoscopy due, referral placed to GI.  Discussed the importance of a healthy diet and regular exercise in order for weight loss, and to reduce the risk of further co-morbidity.  Exam stable. Labs pending.  Follow up in 1 year for repeat physical.    Estrogen deficiency -     DG Bone Density; Future  Screening for colon cancer -     Ambulatory referral to Gastroenterology  Paroxysmal atrial fibrillation [facility]) Assessment & Plan: Rate and rhythm regular today. Following with cardiology, office notes reviewed from August 2024.  Continue Xarelto 20 mg daily, metoprolol succinate 50 mg twice daily.   Mixed hyperlipidemia Assessment & Plan: Repeat lipid panel pending.  Continue rosuvastatin 10 mg every other day, Zetia 10 mg daily.  Orders: -     Lipid panel -     Comprehensive metabolic panel  Prediabetes Assessment & Plan: Repeat A1c pending.  Orders: -     Hemoglobin A1c        Doreene Nest, NP

## 2023-05-09 NOTE — Patient Instructions (Signed)
Stop by the lab prior to leaving today. I will notify you of your results once received.   [facility] to schedule your bone density scan.   You will either be contacted via phone regarding your referral to GI for the colonoscopy, or you may receive a letter on your MyChart portal from our referral team with instructions for scheduling an appointment. Please let us know if you have not been contacted by anyone within two weeks.  It was a pleasure to see you today!  

## 2023-05-09 NOTE — Assessment & Plan Note (Signed)
 Repeat A1c pending

## 2023-05-09 NOTE — Assessment & Plan Note (Signed)
 Declines pneumonia vaccine.  Will contact pharmacy for Shingrix vaccines. Mammogram up-to-date. Bone density scan due, orders placed. Colonoscopy due, referral placed to GI.  Discussed the importance of a healthy diet and regular exercise in order for weight loss, and to reduce the risk of further co-morbidity.  Exam stable. Labs pending.  Follow up in 1 year for repeat physical.

## 2023-05-09 NOTE — Assessment & Plan Note (Signed)
 Repeat lipid panel pending.  Continue rosuvastatin 10 mg every other day, Zetia 10 mg daily.

## 2023-05-09 NOTE — Assessment & Plan Note (Signed)
 Rate and rhythm regular today. Following with cardiology, office notes reviewed from August 2024.  Continue Xarelto 20 mg daily, metoprolol succinate 50 mg twice daily.

## 2023-05-19 ENCOUNTER — Ambulatory Visit
Admission: RE | Admit: 2023-05-19 | Discharge: 2023-05-19 | Disposition: A | Discharge status: Home / Self Care | Source: Ambulatory Visit | Attending: Primary Care | Admitting: Primary Care

## 2023-05-19 DIAGNOSIS — E2839 Other primary ovarian failure: Secondary | ICD-10-CM | POA: Diagnosis not present

## 2023-05-22 ENCOUNTER — Encounter: Payer: Self-pay | Admitting: Internal Medicine

## 2023-05-25 ENCOUNTER — Encounter: Payer: Self-pay | Admitting: Physician Assistant

## 2023-06-16 ENCOUNTER — Other Ambulatory Visit: Payer: Self-pay

## 2023-06-16 ENCOUNTER — Ambulatory Visit: Attending: Cardiology | Admitting: Cardiology

## 2023-06-16 ENCOUNTER — Encounter: Payer: Self-pay | Admitting: Cardiology

## 2023-06-16 ENCOUNTER — Other Ambulatory Visit: Payer: Self-pay | Admitting: Cardiovascular Disease

## 2023-06-16 VITALS — BP 170/80 | HR 51 | Ht 62.0 in | Wt 177.0 lb

## 2023-06-16 DIAGNOSIS — E782 Mixed hyperlipidemia: Secondary | ICD-10-CM

## 2023-06-16 DIAGNOSIS — I10 Essential (primary) hypertension: Secondary | ICD-10-CM | POA: Diagnosis not present

## 2023-06-16 DIAGNOSIS — R7303 Prediabetes: Secondary | ICD-10-CM

## 2023-06-16 DIAGNOSIS — I480 Paroxysmal atrial fibrillation: Secondary | ICD-10-CM | POA: Diagnosis not present

## 2023-06-16 MED ORDER — OMRON 3 SERIES BP MONITOR DEVI
0 refills | Status: AC
  Filled 2023-06-16: qty 1, 30d supply, fill #0

## 2023-06-16 MED ORDER — FUROSEMIDE 20 MG PO TABS: 20.0000 mg | ORAL_TABLET | Freq: Every day | ORAL | 3 refills | Status: DC | PRN

## 2023-06-16 MED ORDER — BLOOD PRESSURE CUFF MISC
0 refills | Status: DC
Start: 2023-06-16 — End: 2023-09-19

## 2023-06-16 MED ORDER — POTASSIUM CHLORIDE CRYS ER 20 MEQ PO TBCR: 20.0000 meq | EXTENDED_RELEASE_TABLET | Freq: Every day | ORAL | 3 refills | Status: AC | PRN

## 2023-06-16 NOTE — Patient Instructions (Addendum)
 Medication Instructions:  Your physician recommends the following medication changes.  CHANGE: Lasix  daily as needed Potassium daily as needed   *If you need a refill on your cardiac medications before your next appointment, please call your pharmacy*  Lab Work: No labs ordered today  If you have labs (blood work) drawn today and your tests are completely normal, you will receive your results only by: MyChart Message (if you have MyChart) OR A paper copy in the mail If you have any lab test that is abnormal or we need to change your treatment, we will call you to review the results.  Testing/Procedures: No test ordered today   Follow-Up: At [facility], you and your health needs are our priority.  As part of our continuing mission to provide you with exceptional heart care, our providers are all part of one team.  This team includes your primary Cardiologist (physician) and Advanced Practice Providers or APPs (Physician Assistants and Nurse Practitioners) who all work together to provide you with the care you need, when you need it.  Your next appointment:   6 month(s)  Provider:   Timothy Gollan, MD or Ronald Cockayne, NP      Other Instructions BP monitor for 2 weeks and send readings through MyChart.  Please monitor blood pressures and keep a log of your readings.   Make sure to check 2 hours after your medications.   AVOID these things for 30 minutes before checking your blood pressure: No Drinking caffeine. No Drinking alcohol. No Eating. No Smoking. No Exercising.  Five minutes before checking your blood pressure: Pee. Sit in a dining chair. Avoid sitting in a soft couch or armchair. Be quiet. Do not talk.

## 2023-06-16 NOTE — Progress Notes (Signed)
 Cardiology Office Note:  .   Date:  06/16/2023  ID:  Valerie Sparks, DOB 1956-02-03, MRN [id number] PCP: Gabriel John, NP  [facility] Providers Cardiologist:  None    History of Present Illness: .   Valerie Sparks is a 68 y.o. adult with past medical history of paroxysmal atrial fibrillation, mixed hyperlipidemia, elevated blood pressures and diagnoses of hypertension, prediabetes, who is here today to follow-up on her paroxysmal atrial fibrillation and hyperlipidemia.   Prior ZIO monitor revealed 41% burden of atrial fibrillation with average heart rate 107 bpm. Echocardiogram July 2024 with normal left and right ventricular size and function, mildly elevated right heart pressures, moderate mitral regurgitation, moderate to severe tricuspid valve regurgitation.  She subsequently started on furosemide  20 mg daily with potassium 20 mEq daily.  She was placed on rosuvastatin  and started developing hand cramps.  She continued to have paroxysmal tachycardia and paroxysmal atrial fibrillation.  She was started on metoprolol  tartrate and was having regular meals overnight.  She continued to use Apple Watch to monitor for heart arrhythmias.   She was last seen in clinic 09/30/2022 by Dr. Gollan.  Breakthrough atrial fibrillation episodes at that time had been improving.  She was changed to metoprolol  tartrate to metoprolol  succinate 50 mg twice daily and continued on Xarelto  20 mg daily.  Rosuvastatin  was decreased to 10 mg every other day and Zetia  10 mg daily.  She returns to clinic today stating that for the most part she has been doing fairly well.  She states that she occasionally continues to have bouts of atrial fibrillation where her heart rate has been as high as 128.  She has not required the use of any of her as needed Toprol -XL.  She states that she is no longer taking Lasix  and potassium supplementations daily but on an as-needed basis.  She also states that she would like to  come off of cholesterol medicine.  Unfortunately prior to her appointment today she has not had any of her medications.  Her blood pressure cuff at home she stated broke approximately a month to a month and a half ago and she has not replaced since that she has not been monitoring pressure at home.  She continues to monitor her heart rate on her Apple watch.  She states that she has not missed any of her Xarelto  and denies any bleeding.  She has not noticed any blood in her stool or urine.  Denies any hospitalizations or visits to the emergency department.  ROS: 10 point review of systems has been reviewed and considered negative except ones been listed in the HPI  Studies Reviewed: Aaron Aas   EKG Interpretation Date/Time:  Friday Jun 16 2023 08:04:19 EDT Ventricular Rate:  52 PR Interval:  154 QRS Duration:  80 QT Interval:  444 QTC Calculation: 412 R Axis:   33  Text Interpretation: Sinus bradycardia When compared with ECG of 30-Sep-2022 10:27, Premature atrial complexes are no longer Present Confirmed by Ronald Cockayne ([id number]) on 06/16/2023 8:10:41 AM    2D echo 08/24/2022  1. Left ventricular ejection fraction, by estimation, is 60 to 65%. The  left ventricle has normal function. The left ventricle has no regional  wall motion abnormalities. Left ventricular diastolic parameters were  normal.   2. Right ventricular systolic function is normal. The right ventricular  size is normal. There is mildly elevated pulmonary artery systolic  pressure. The estimated right ventricular systolic pressure is 41.2 mmHg.  3. The mitral valve is normal in structure. Moderate mitral valve  regurgitation. No evidence of mitral stenosis.   4. Tricuspid valve regurgitation is moderate to severe.   5. The aortic valve is normal in structure. Aortic valve regurgitation is  not visualized. Aortic valve sclerosis is present, with no evidence of  aortic valve stenosis.   6. The inferior vena cava is normal in size  with greater than 50%  respiratory variability, suggesting right atrial pressure of 3 mmHg.   Event Monitor (Zio) 06/28/2022 HR 48 - 234 bpm, average 84 bpm. 1 nonsustained VT lasting 7 beats. 41% AF burden, average HR 107 bpm Symptom triggers correspond to AF. Rare ventricular ectopy.   Urgent cardiology referral placed for new diagnosis AF.    Risk Assessment/Calculations:    CHA2DS2-VASc Score = 3   This indicates a 3.2% annual risk of stroke. The patient's score is based upon: CHF History: 0 HTN History: 1 Diabetes History: 0 Stroke History: 0 Vascular Disease History: 0 Age Score: 1 Gender Score: 1    HYPERTENSION CONTROL Vitals:   06/16/23 0800 06/16/23 0815  BP: (!) 170/88 (!) 170/80    The patient's blood pressure is elevated above target today.  In order to address the patient's elevated BP: Blood pressure will be monitored at home to determine if medication changes need to be made. (patient to upload Bp from home over the next rwo weeks)          Physical Exam:   VS:  BP (!) 170/80 (BP Location: Left Arm, Patient Position: Sitting, Cuff Size: Normal)   Pulse (!) 51   Ht 5\' 2"  (1.575 m)   Wt 177 lb (80.3 kg)   SpO2 98%   BMI 32.37 kg/m    Wt Readings from Last 3 Encounters:  06/16/23 177 lb (80.3 kg)  05/09/23 178 lb (80.7 kg)  09/30/22 176 lb 3.2 oz (79.9 kg)    GEN: Well nourished, well developed in no acute distress NECK: No JVD; No carotid bruits CARDIAC: RRR, no murmurs, rubs, gallops RESPIRATORY:  Clear to auscultation without rales, wheezing or rhonchi  ABDOMEN: Soft, non-tender, non-distended EXTREMITIES:  No edema; No deformity   ASSESSMENT AND PLAN: .   Paroxysmal atrial fibrillation which she has been maintaining sinus rhythm with sinus bradycardia today with a heart rate of 52 and no significant changes noted from prior studies.  She is continued on Toprol -XL 50 mg daily unfortunately she has not had any of her medication as of yet so  she has had fluctuations of increased heart rate it is time for her medications.  She has had several episodes of breakthrough atrial fibrillation but has not taken any of her as needed Toprol  all since her last visit.  We did discuss today means she is symptomatic when she is in A-fib without staying and prolonged period of time alone for cardioversion if she would be interested in seeing electrophysiology for further antiarrhythmics or other procedures.  She states that if she continues to have the breakthrough palpitations and elevated heart rates with her A-fib that she will let us  know about a referral to EP.  Mixed hyperlipidemia with last LDL of 78.  Huge improvement from previous lab results.  She continues to alternate taking rosuvastatin  10 mg every other day and ezetimibe  every other day alternating with rosuvastatin .  Cholesterol panel is continues to be followed by her PCP.  Primary hypertension with a blood pressure of 170/86 today.  She has  not had a medication yet today.  Previously was evaluated at her PCPs office with blood pressure of 122/70 after having her medications.  Blood pressure seems to be well-controlled when she has had her medications prior to her visits.  She has been encouraged to take her blood pressure 1 to 2 hours postmedication ministration and upload her blood pressure and heart rate for the next 2 weeks to her MyChart.  She has been provided a prescription today for new blood pressure cuff as well.  Prediabetes with last hemoglobin A1c of 6.3.  She has been encouraged to continue with increasing her activity and her dietary changes.  Ongoing management per PCP       Dispo: Patient to return to clinic to see MD/APP in 6 months or sooner if needed for reevaluation.  If blood pressures and heart rates are varying and requires medication changes follow-up appointment will be moved up and she will be seen sooner.  Signed, Arth Nicastro, NP

## 2023-07-08 ENCOUNTER — Other Ambulatory Visit: Payer: Self-pay | Admitting: Cardiovascular Disease

## 2023-07-11 NOTE — Telephone Encounter (Signed)
 Pt last saw Valerie Metz, NP on 06/16/23, last labs 05/09/23 Creat 0.76, age 68, weight 80.3kg, CrCl 91.06, based on CrCl pt is on appropriate dosage of Xarelto  20mg  every day for afib.  Will refill rx.

## 2023-07-31 ENCOUNTER — Ambulatory Visit: Admitting: Physician Assistant

## 2023-07-31 ENCOUNTER — Encounter: Payer: Self-pay | Admitting: Physician Assistant

## 2023-07-31 ENCOUNTER — Telehealth: Payer: Self-pay

## 2023-07-31 VITALS — BP 140/78 | HR 51 | Ht 62.0 in | Wt 183.0 lb

## 2023-07-31 DIAGNOSIS — K649 Unspecified hemorrhoids: Secondary | ICD-10-CM | POA: Diagnosis not present

## 2023-07-31 DIAGNOSIS — Z01818 Encounter for other preprocedural examination: Secondary | ICD-10-CM

## 2023-07-31 DIAGNOSIS — I4891 Unspecified atrial fibrillation: Secondary | ICD-10-CM | POA: Diagnosis not present

## 2023-07-31 DIAGNOSIS — Z1211 Encounter for screening for malignant neoplasm of colon: Secondary | ICD-10-CM

## 2023-07-31 DIAGNOSIS — K625 Hemorrhage of anus and rectum: Secondary | ICD-10-CM

## 2023-07-31 DIAGNOSIS — I480 Paroxysmal atrial fibrillation: Secondary | ICD-10-CM

## 2023-07-31 NOTE — Patient Instructions (Signed)
 You have been scheduled for a colonoscopy. Please follow written instructions given to you at your visit today.   If you use inhalers (even only as needed), please bring them with you on the day of your procedure.  DO NOT TAKE 7 DAYS PRIOR TO TEST- Trulicity (dulaglutide) Ozempic, Wegovy (semaglutide) Mounjaro (tirzepatide) Bydureon Bcise (exanatide extended release)  DO NOT TAKE 1 DAY PRIOR TO YOUR TEST Rybelsus (semaglutide) Adlyxin (lixisenatide) Victoza (liraglutide) Byetta (exanatide) ___________________________________________________________________________  Elene Griffes will be contacted by our office prior to your procedure for directions on holding your Eliquis.  If you do not hear from our office 1 week prior to your scheduled procedure, please call [phone] to discuss.  Thank you for trusting me with your gastrointestinal care!  Reginal Capra, PA-C   _______________________________________________________  If your blood pressure at your visit was 140/90 or greater, please contact your primary care physician to follow up on this.  _______________________________________________________  If you are age 47 or older, your body mass index should be between 23-30. Your Body mass index is 33.47 kg/m. If this is out of the aforementioned range listed, please consider follow up with your Primary Care Provider.  If you are age 30 or younger, your body mass index should be between 19-25. Your Body mass index is 33.47 kg/m. If this is out of the aformentioned range listed, please consider follow up with your Primary Care Provider.   ________________________________________________________  The [facility] providers would like to encourage you to use MYCHART to communicate with providers for non-urgent requests or questions.  Due to long hold times on the telephone, sending your provider a message by [facility] may be a faster and more efficient way to get a response.  Please allow 48  business hours for a response.  Please remember that this is for non-urgent requests.  _______________________________________________________

## 2023-07-31 NOTE — Progress Notes (Signed)
 Chief Complaint: Discuss colonoscopy  HPI:    Valerie Sparks is a 68 year old female with a past medical history as listed below including A-fib on Xarelto  (08/24/2022 echo with LVEF 60-65%, moderate mitral valve regurgitation, moderate to severe tricuspid valve regurgitation), known to Dr. Willy Harvest, who was referred to me by Gabriel John, NP for consideration of a screen colonoscopy and some rectal bleeding.    04/26/2013 colonoscopy was normal with excellent prep with anal skin tags.  Repeat recommended 10 years.    06/16/2023 patient saw cardiology about A-fib, at that time having some breakthrough episodes.  At that time they continued her on Xarelto .    Today, patient tells me that her primary care provider has been telling her that she really needs to get her colonoscopy done because she is due again.  She had some trouble with the bowel prep last time and requests MiraLAX.  She does tell me that she went to the bathroom last week around Monday and had an okay bowel movement but then got in the shower and noticed she was dripping some blood down her leg, she thinks she may have irritated her hemorrhoids at the time.  This has stopped and she has not seen any since.  No rectal pain.  No change in bowel habits.    Denies fever, chills or weight loss.  Past Medical History:  Diagnosis Date   Allergy    Frequent headaches    Herpes zoster without complication 12/03/2018   Hyperlipidemia     Past Surgical History:  Procedure Laterality Date   ABLATION     BUNIONECTOMY     right   TOOTH EXTRACTION     TUBAL LIGATION      Current Outpatient Medications  Medication Sig Dispense Refill   Blood Pressure Monitoring (BLOOD PRESSURE CUFF) MISC Check blood pressure as instructed by your physician 1 each 0   Blood Pressure Monitoring (OMRON 3 SERIES BP MONITOR) DEVI use as directed to check blood pressure 1 each 0   ezetimibe  (ZETIA ) 10 MG tablet Take 1 tablet (10 mg total) by mouth daily. 90  tablet 3   furosemide  (LASIX ) 20 MG tablet Take 1 tablet (20 mg total) by mouth daily as needed. 180 tablet 3   ibuprofen  (ADVIL ) 800 MG tablet Take 1 tablet (800 mg total) by mouth daily as needed for moderate pain. 90 tablet 0   metoprolol  succinate (TOPROL -XL) 50 MG 24 hr tablet Take 1 tablet (50 mg total) by mouth in the morning and at bedtime. Take with or immediately following a meal. 180 tablet 3   potassium chloride  SA (KLOR-CON  M) 20 MEQ tablet Take 1 tablet (20 mEq total) by mouth daily as needed. 90 tablet 3   rivaroxaban  (XARELTO ) 20 MG TABS tablet TAKE 1 TABLET BY MOUTH DAILY WITH SUPPER. 90 tablet 1   rosuvastatin  (CRESTOR ) 10 MG tablet Take 1 tablet (10 mg total) by mouth every other day. 45 tablet 3   No current facility-administered medications for this visit.    Allergies as of 07/31/2023 - Review Complete 07/31/2023  Allergen Reaction Noted   Actifed cold-allergy [chlorpheniramine-phenyleph er]  04/26/2013   Pseudoephedrine Other (See Comments)    Simvastatin   06/29/2022    Family History  Problem Relation Age of Onset   Hypertension Mother    Diabetes Mother    Heart disease Father    Diabetes Sister    Breast cancer Daughter 21   Breast cancer Paternal Aunt 50  Cancer Maternal Grandmother    Heart attack Paternal Grandfather    Kidney disease Paternal Grandfather    Diabetes Brother    Colon cancer Neg Hx    Esophageal cancer Neg Hx    Rectal cancer Neg Hx    Stomach cancer Neg Hx     Social History   Socioeconomic History   Marital status: Married    Spouse name: Not on file   Number of children: 1   Years of education: Not on file   Highest education level: 12th grade  Occupational History   Not on file  Tobacco Use   Smoking status: Never   Smokeless tobacco: Never  Vaping Use   Vaping status: Never Used  Substance and Sexual Activity   Alcohol use: Yes    Comment: very rarely   Drug use: No   Sexual activity: Yes  Other Topics Concern    Not on file  Social History Narrative   Not on file   Social Drivers of Health   Financial Resource Strain: Low Risk  (05/08/2023)   Overall Financial Resource Strain (CARDIA)    Difficulty of Paying Living Expenses: Not hard at all  Food Insecurity: No Food Insecurity (05/08/2023)   Hunger Vital Sign    Worried About Running Out of Food in the Last Year: Never true    Ran Out of Food in the Last Year: Never true  Transportation Needs: No Transportation Needs (05/08/2023)   PRAPARE - Administrator, Civil Service (Medical): No    Lack of Transportation (Non-Medical): No  Physical Activity: Insufficiently Active (05/08/2023)   Exercise Vital Sign    Days of Exercise per Week: 3 days    Minutes of Exercise per Session: 30 min  Stress: No Stress Concern Present (05/08/2023)   [employer] of Occupational Health - Occupational Stress Questionnaire    Feeling of Stress : Not at all  Social Connections: Moderately Integrated (05/08/2023)   Social Connection and Isolation Panel    Frequency of Communication with Friends and Family: More than three times a week    Frequency of Social Gatherings with Friends and Family: Twice a week    Attends Religious Services: 1 to 4 times per year    Active Member of [employer] or Organizations: No    Attends Banker Meetings: Never    Marital Status: Married  Catering manager Violence: Not At Risk (08/10/2022)   Humiliation, Afraid, Rape, and Kick questionnaire    Fear of Current or Ex-Partner: No    Emotionally Abused: No    Physically Abused: No    Sexually Abused: No    Review of Systems:    Constitutional: No weight loss, fever or chills Skin: No rash Cardiovascular: No chest pain Respiratory: No SOB  Gastrointestinal: See HPI and otherwise negative Genitourinary: No dysuria  Neurological: No headache, dizziness or syncope Musculoskeletal: No new muscle or joint pain Hematologic: No bruising Psychiatric: No  history of depression or anxiety   Physical Exam:  Vital signs: BP (!) 140/78 (BP Location: Right Arm, Patient Position: Sitting, Cuff Size: Normal)   Pulse (!) 51   Ht 5' 2 (1.575 m)   Wt 183 lb (83 kg)   BMI 33.47 kg/m   Constitutional:   Pleasant Caucasian female appears to be in NAD, Well developed, Well nourished, alert and cooperative Head:  Normocephalic and atraumatic. Eyes:   PEERL, EOMI. No icterus. Conjunctiva pink. Ears:  Normal auditory acuity. Neck:  Supple  Throat: Oral cavity and pharynx without inflammation, swelling or lesion.  Respiratory: Respirations even and unlabored. Lungs clear to auscultation bilaterally.   No wheezes, crackles, or rhonchi.  Cardiovascular: Normal S1, S2. No MRG. Regular rate and rhythm. No peripheral edema, cyanosis or pallor.  Gastrointestinal:  Soft, nondistended, nontender. No rebound or guarding. Normal bowel sounds. No appreciable masses or hepatomegaly. Rectal: Declined Msk:  Symmetrical without gross deformities. Without edema, no deformity or joint abnormality.  Neurologic:  Alert and  oriented x4;  grossly normal neurologically.  Skin:   Dry and intact without significant lesions or rashes. Psychiatric: Demonstrates good judgement and reason without abnormal affect or behaviors.  RELEVANT LABS AND IMAGING: CBC    Component Value Date/Time   WBC 4.6 06/06/2022 1157   RBC 5.41 (H) 06/06/2022 1157   HGB 14.9 06/06/2022 1157   HGB 13.8 05/04/2018 0912   HCT 44.9 06/06/2022 1157   HCT 41.3 05/04/2018 0912   PLT 271.0 06/06/2022 1157   PLT 305 05/04/2018 0912   MCV 82.9 06/06/2022 1157   MCV 83 05/04/2018 0912   MCH 27.8 05/04/2018 0912   MCHC 33.2 06/06/2022 1157   RDW 14.2 06/06/2022 1157   RDW 13.2 05/04/2018 0912   LYMPHSABS 1.7 05/04/2018 0912   MONOABS 0.5 06/19/2008 1558   EOSABS 0.1 05/04/2018 0912   BASOSABS 0.0 05/04/2018 0912    CMP     Component Value Date/Time   NA 140 05/09/2023 0927   NA 137 05/04/2018  0912   K 4.2 05/09/2023 0927   CL 103 05/09/2023 0927   CO2 29 05/09/2023 0927   GLUCOSE 103 (H) 05/09/2023 0927   BUN 15 05/09/2023 0927   BUN 15 05/04/2018 0912   CREATININE 0.76 05/09/2023 0927   CALCIUM  9.3 05/09/2023 0927   PROT 6.4 05/09/2023 0927   PROT 6.4 05/04/2018 0912   ALBUMIN 4.2 05/09/2023 0927   ALBUMIN 4.3 05/04/2018 0912   AST 30 05/09/2023 0927   ALT 22 05/09/2023 0927   ALKPHOS 53 05/09/2023 0927   BILITOT 0.7 05/09/2023 0927   BILITOT 0.5 05/04/2018 0912   GFRNONAA >60 09/21/2022 1151   GFRAA 93 05/04/2018 0912    Assessment: 1.  Screening for colorectal cancer: Last colonoscopy 10 years ago, patient is due now 2.  A-fib on Xarelto  3.  Rectal bleeding: Known hemorrhoids, she bled 1 day last week and none since, declined rectal exam but wanted Dr. Willy Harvest to be aware when he did colonoscopy so he can evaluate further  Plan: 1.  Scheduled patient for colonoscopy in the [facility] with Dr. Willy Harvest.  Did provide the patient a detailed list of risks for the procedure and she agrees to proceed. Patient is appropriate for endoscopic procedure(s) in the ambulatory ([facility]) setting.  2.  Patient was advised to hold her Xarelto  for 2 days prior to time of procedure.  We will communicate with her prescribing physician to ensure this is acceptable for her. 3.  Patient requested a MiraLAX bowel prep 4.  We briefly discussed hemorrhoid banding but patient is nervous to have anything done with her hemorrhoids in the future given that her sister had surgery and now has lost nerve function to her rectum and is incontinent. 5.  Patient to follow in clinic per recommendations after time of procedure.  Reginal Capra, PA-C [facility] 07/31/2023, 8:44 AM  Cc: Gabriel John, NP

## 2023-07-31 NOTE — Telephone Encounter (Signed)
 [facility] Pre-operative Risk Assessment     Request for surgical clearance:     Endoscopy Procedure  What type of surgery is being performed?     Colonoscopy  When is this surgery scheduled?     09/19/23  What type of clearance is required ?   Pharmacy  Are there any medications that need to be held prior to surgery and how long? Xarelto  for 2 days  Practice name and name of physician performing surgery?       Gastroenterology  What is your office phone and fax number?      Phone- [phone]  Fax- [phone]  Anesthesia type (None, local, MAC, general) ?       MAC   Please route your response to [employer], CMA

## 2023-08-01 NOTE — Telephone Encounter (Signed)
 Please advise holding Xarelto  prior to colonoscopy on 09/19/2023.  Thank you!  DW

## 2023-08-04 ENCOUNTER — Telehealth: Payer: Self-pay | Admitting: *Deleted

## 2023-08-04 NOTE — Telephone Encounter (Signed)
   Name: Valerie Sparks  DOB: 29-Jul-1955  MRN: [id number]  Primary Cardiologist: None   Preoperative team, please contact this patient and set up a phone call appointment for further preoperative risk assessment. Please obtain consent and complete medication review. Thank you for your help. Last seen by Dr. Mallipeddi on 05/08/2023.  I confirm that guidance regarding antiplatelet and oral anticoagulation therapy has been completed and, if necessary, noted below. Per note, he was advised to stop Plavix on 07/22/2023 and continue ASA daily.   I also confirmed the patient resides in the state of [place] . As per [facility] telemedicine laws, the patient must reside in the state in which the provider is licensed.   Friddie Jetty, NP 08/04/2023, 12:08 PM [facility]

## 2023-08-04 NOTE — Telephone Encounter (Signed)
 Patient with diagnosis of atrial fibrillation on Xarelto  for anticoagulation.     Request for surgical clearance:     Endoscopy Procedure  What type of surgery is being performed?     Colonoscopy      CHA2DS2-VASc Score = 3   This indicates a 3.2% annual risk of stroke. The patient's score is based upon: CHF History: 0 HTN History: 1 Diabetes History: 0 Stroke History: 0 Vascular Disease History: 0 Age Score: 1 Gender Score: 1     CrCl 94 Platelet count - none in > 1 year   Patient has not had an Afib/aflutter ablation within the last 3 months or DCCV within the last 30 days   Will need updated CBC for final clearance

## 2023-08-04 NOTE — Telephone Encounter (Signed)
 S/w the pt and she agrees to have lab work done. Pt said she will come to [facility] and have labs done. Pt has been given address for [address] And [employer] is on the 1st floor.   I will place the lab order and release for [facility]. Pt states she is out of town this week but she will get CBC next Wed 6/24 or Thurs 6/25.   Pt thanked me for my help.

## 2023-08-04 NOTE — Addendum Note (Signed)
 Addended by: Ophelia Billet on: 08/04/2023 10:21 AM   Modules accepted: Orders

## 2023-08-04 NOTE — Telephone Encounter (Signed)
 Pt has been scheduled tele preop appt 08/30/23. Med rec and consent are done.     Patient Consent for Virtual Visit        Valerie Sparks has provided verbal consent on 08/04/2023 for a virtual visit (video or telephone).   CONSENT FOR VIRTUAL VISIT FOR:  Valerie Sparks  By participating in this virtual visit I agree to the following:  I hereby voluntarily request, consent and authorize [facility] and its employed or contracted physicians, physician assistants, nurse practitioners or other licensed health care professionals (the Practitioner), to provide me with telemedicine health care services (the "Services) as deemed necessary by the treating Practitioner. I acknowledge and consent to receive the Services by the Practitioner via telemedicine. I understand that the telemedicine visit will involve communicating with the Practitioner through live audiovisual communication technology and the disclosure of certain medical information by electronic transmission. I acknowledge that I have been given the opportunity to request an in-person assessment or other available alternative prior to the telemedicine visit and am voluntarily participating in the telemedicine visit.  I understand that I have the right to withhold or withdraw my consent to the use of telemedicine in the course of my care at any time, without affecting my right to future care or treatment, and that the Practitioner or I may terminate the telemedicine visit at any time. I understand that I have the right to inspect all information obtained and/or recorded in the course of the telemedicine visit and may receive copies of available information for a reasonable fee.  I understand that some of the potential risks of receiving the Services via telemedicine include:  Delay or interruption in medical evaluation due to technological equipment failure or disruption; Information transmitted may not be sufficient (e.g. poor  resolution of images) to allow for appropriate medical decision making by the Practitioner; and/or  In rare instances, security protocols could fail, causing a breach of personal health information.  Furthermore, I acknowledge that it is my responsibility to provide information about my medical history, conditions and care that is complete and accurate to the best of my ability. I acknowledge that Practitioner's advice, recommendations, and/or decision may be based on factors not within their control, such as incomplete or inaccurate data provided by me or distortions of diagnostic images or specimens that may result from electronic transmissions. I understand that the practice of medicine is not an exact science and that Practitioner makes no warranties or guarantees regarding treatment outcomes. I acknowledge that a copy of this consent can be made available to me via my patient portal [facility] MyChart), or I can request a printed copy by calling the office of [facility].    I understand that my insurance will be billed for this visit.   I have read or had this consent read to me. I understand the contents of this consent, which adequately explains the benefits and risks of the Services being provided via telemedicine.  I have been provided ample opportunity to ask questions regarding this consent and the Services and have had my questions answered to my satisfaction. I give my informed consent for the services to be provided through the use of telemedicine in my medical care

## 2023-08-04 NOTE — Telephone Encounter (Signed)
 Patient with diagnosis of atrial fibrillation on Xarelto  for anticoagulation.    Request for surgical clearance:     Endoscopy Procedure  What type of surgery is being performed?     Colonoscopy    CHA2DS2-VASc Score = 3   This indicates a 3.2% annual risk of stroke. The patient's score is based upon: CHF History: 0 HTN History: 1 Diabetes History: 0 Stroke History: 0 Vascular Disease History: 0 Age Score: 1 Gender Score: 1    CrCl 94 Platelet count - none in > 1 year  Patient has not had an Afib/aflutter ablation within the last 3 months or DCCV within the last 30 days  Will need updated CBC for final clearance  **This guidance is not considered finalized until pre-operative APP has relayed final recommendations.**

## 2023-08-04 NOTE — Telephone Encounter (Signed)
 Pt has been scheduled tele preop appt 08/30/23. Med rec and consent are done.

## 2023-08-07 ENCOUNTER — Other Ambulatory Visit: Payer: Self-pay | Admitting: Cardiovascular Disease

## 2023-08-07 DIAGNOSIS — I480 Paroxysmal atrial fibrillation: Secondary | ICD-10-CM

## 2023-08-08 NOTE — Telephone Encounter (Signed)
 Xarelto  20mg  refill request received. Pt is 68 years old, weight-83kg, Crea-0.76 on 05/09/23, last seen by Tylene Lunch on 06/16/23, Diagnosis-Afib, CrCl- 94.12 mL/min; Dose is appropriate based on dosing criteria. Will send in refill to requested pharmacy.

## 2023-08-14 ENCOUNTER — Ambulatory Visit (INDEPENDENT_AMBULATORY_CARE_PROVIDER_SITE_OTHER): Payer: Medicare HMO

## 2023-08-14 VITALS — BP 99/59 | Ht 62.0 in | Wt 178.0 lb

## 2023-08-14 DIAGNOSIS — Z0000 Encounter for general adult medical examination without abnormal findings: Secondary | ICD-10-CM

## 2023-08-14 NOTE — Patient Instructions (Signed)
 Ms. Alviar , Thank you for taking time out of your busy schedule to complete your Annual Wellness Visit with me. I enjoyed our conversation and look forward to speaking with you again next year. I, as well as your care team,  appreciate your ongoing commitment to your health goals. Please review the following plan we discussed and let me know if I can assist you in the future. Your Game plan/ To Do List    Referrals: If you haven't heard from the office you've been referred to, please reach out to them at the phone provided.  none Follow up Visits: Next Medicare AWV with our clinical staff: 08/14/2024   Have you seen your provider in the last 6 months (3 months if uncontrolled diabetes)? Yes Next Office Visit with your provider: n/a  Clinician Recommendations:  Aim for 30 minutes of exercise or brisk walking, 6-8 glasses of water, and 5 servings of fruits and vegetables each day.       This is a list of the screening recommended for you and due dates:  Health Maintenance  Topic Date Due   COVID-19 Vaccine (1) Never done   Hepatitis C Screening  Never done   Pneumococcal Vaccine for age over 57 (1 of 2 - PCV) Never done   Zoster (Shingles) Vaccine (1 of 2) Never done   Colon Cancer Screening  04/27/2023   Flu Shot  09/15/2023   Medicare Annual Wellness Visit  08/13/2024   Mammogram  03/16/2025   DTaP/Tdap/Td vaccine (3 - Td or Tdap) 05/09/2029   DEXA scan (bone density measurement)  Completed   Hepatitis B Vaccine  Aged Out   HPV Vaccine  Aged Out   Meningitis B Vaccine  Aged Out    Advanced directives: (Copy Requested) Please bring a copy of your health care power of attorney and living will to the office to be added to your chart at your convenience. You can mail to [facility] [address] [place], [place] [address] or email to [email or web address] Advance Care Planning is important because it:  [x]  Makes sure you receive the medical care that is  consistent with your values, goals, and preferences  [x]  It provides guidance to your family and loved ones and reduces their decisional burden about whether or not they are making the right decisions based on your wishes.  Follow the link provided in your after visit summary or read over the paperwork we have mailed to you to help you started getting your Advance Directives in place. If you need assistance in completing these, please reach out to us  so that we can help you!  See attachments for Preventive Care and Fall Prevention Tips.

## 2023-08-14 NOTE — Progress Notes (Signed)
 Because this visit was a virtual/telehealth visit,  certain criteria was not obtained, such a blood pressure, CBG if applicable, and timed get up and go. Any medications not marked as taking were not mentioned during the medication reconciliation part of the visit. Any vitals not documented were not able to be obtained due to this being a telehealth visit or patient was unable to self-report a recent blood pressure reading due to a lack of equipment at home via telehealth. Vitals that have been documented are verbally provided by the patient.   This visit was performed by a medical professional under my direct supervision. I was immediately available for consultation/collaboration. I have reviewed and agree with the Annual Wellness Visit documentation.  Subjective:   Valerie Sparks is a 68 y.o. who presents for a Medicare Wellness preventive visit.  As a reminder, Annual Wellness Visits don't include a physical exam, and some assessments may be limited, especially if this visit is performed virtually. We may recommend an in-person follow-up visit with your provider if needed.  Visit Complete: Virtual I connected with  Valerie Sparks on 08/14/23 by a audio enabled telemedicine application and verified that I am speaking with the correct person using two identifiers.  Patient Location: Home  Provider Location: Home Office  I discussed the limitations of evaluation and management by telemedicine. The patient expressed understanding and agreed to proceed.  Vital Signs: Because this visit was a virtual/telehealth visit, some criteria may be missing or patient reported. Any vitals not documented were not able to be obtained and vitals that have been documented are patient reported.  VideoDeclined- This patient declined Librarian, academic. Therefore the visit was completed with audio only.  Persons Participating in Visit: Patient.  AWV Questionnaire: No: Patient  Medicare AWV questionnaire was not completed prior to this visit.  Cardiac Risk Factors include: advanced age (>13men, >64 women);obesity (BMI >30kg/m2);hypertension     Objective:    Today's Vitals   08/14/23 0924  BP: (!) 99/59  Weight: 178 lb (80.7 kg)  Height: 5' 2 (1.575 m)   Body mass index is 32.56 kg/m.     08/14/2023    9:24 AM 08/10/2022    9:24 AM  Advanced Directives  Does Patient Have a Medical Advance Directive? No Yes  Type of Special educational needs teacher of [place];Living will  Copy of Healthcare Power of Attorney in Chart?  No - copy requested  Would patient like information on creating a medical advance directive? No - Patient declined     Current Medications (verified) Outpatient Encounter Medications as of 08/14/2023  Medication Sig   Blood Pressure Monitoring (BLOOD PRESSURE CUFF) MISC Check blood pressure as instructed by your physician   Blood Pressure Monitoring (OMRON 3 SERIES BP MONITOR) DEVI use as directed to check blood pressure   ezetimibe  (ZETIA ) 10 MG tablet Take 1 tablet (10 mg total) by mouth daily.   furosemide  (LASIX ) 20 MG tablet Take 1 tablet (20 mg total) by mouth daily as needed.   ibuprofen  (ADVIL ) 800 MG tablet Take 1 tablet (800 mg total) by mouth daily as needed for moderate pain.   metoprolol  succinate (TOPROL -XL) 50 MG 24 hr tablet Take 1 tablet (50 mg total) by mouth in the morning and at bedtime. Take with or immediately following a meal.   potassium chloride  SA (KLOR-CON  M) 20 MEQ tablet Take 1 tablet (20 mEq total) by mouth daily as needed.   rivaroxaban  (XARELTO ) 20 MG TABS  tablet TAKE 1 TABLET BY MOUTH DAILY WITH SUPPER.   rosuvastatin  (CRESTOR ) 10 MG tablet Take 1 tablet (10 mg total) by mouth every other day.   No facility-administered encounter medications on file as of 08/14/2023.    Allergies (verified) Actifed cold-allergy [chlorpheniramine-phenyleph er], Pseudoephedrine, and Simvastatin    History: Past  Medical History:  Diagnosis Date   Allergy    Frequent headaches    Herpes zoster without complication 12/03/2018   Hyperlipidemia    Past Surgical History:  Procedure Laterality Date   ABLATION     BUNIONECTOMY     right   TOOTH EXTRACTION     TUBAL LIGATION     Family History  Problem Relation Age of Onset   Hypertension Mother    Diabetes Mother    Heart disease Father    Diabetes Sister    Breast cancer Daughter 42   Breast cancer Paternal Aunt 42   Cancer Maternal Grandmother    Heart attack Paternal Grandfather    Kidney disease Paternal Grandfather    Diabetes Brother    Colon cancer Neg Hx    Esophageal cancer Neg Hx    Rectal cancer Neg Hx    Stomach cancer Neg Hx    Social History   Socioeconomic History   Marital status: Married    Spouse name: Not on file   Number of children: 1   Years of education: Not on file   Highest education level: 12th grade  Occupational History   Not on file  Tobacco Use   Smoking status: Never   Smokeless tobacco: Never  Vaping Use   Vaping status: Never Used  Substance and Sexual Activity   Alcohol use: Yes    Comment: very rarely   Drug use: No   Sexual activity: Yes  Other Topics Concern   Not on file  Social History Narrative   Not on file   Social Drivers of Health   Financial Resource Strain: Low Risk  (08/14/2023)   Overall Financial Resource Strain (CARDIA)    Difficulty of Paying Living Expenses: Not hard at all  Food Insecurity: No Food Insecurity (08/14/2023)   Hunger Vital Sign    Worried About Running Out of Food in the Last Year: Never true    Ran Out of Food in the Last Year: Never true  Transportation Needs: No Transportation Needs (08/14/2023)   PRAPARE - Administrator, Civil Service (Medical): No    Lack of Transportation (Non-Medical): No  Physical Activity: Insufficiently Active (08/14/2023)   Exercise Vital Sign    Days of Exercise per Week: 3 days    Minutes of Exercise per  Session: 30 min  Stress: No Stress Concern Present (08/14/2023)   [employer] of Occupational Health - Occupational Stress Questionnaire    Feeling of Stress: Not at all  Social Connections: Moderately Integrated (08/14/2023)   Social Connection and Isolation Panel    Frequency of Communication with Friends and Family: More than three times a week    Frequency of Social Gatherings with Friends and Family: Twice a week    Attends Religious Services: 1 to 4 times per year    Active Member of [employer] or Organizations: No    Attends Banker Meetings: Never    Marital Status: Married    Tobacco Counseling Counseling given: Not Answered    Clinical Intake:  Pre-visit preparation completed: Yes  Pain : No/denies pain     BMI - recorded: 32.56  Nutritional Status: BMI > 30  Obese Nutritional Risks: None Diabetes: No  Lab Results  Component Value Date   HGBA1C 6.3 05/09/2023   HGBA1C 6.1 08/05/2020   HGBA1C 6.1 05/10/2019     How often do you need to have someone help you when you read instructions, pamphlets, or other written materials from your doctor or pharmacy?: 1 - Never     Information entered by :: Valerie Sparks,cma   Activities of Daily Living     08/14/2023    9:29 AM  In your present state of health, do you have any difficulty performing the following activities:  Hearing? 0  Vision? 0  Difficulty concentrating or making decisions? 0  Walking or climbing stairs? 0  Dressing or bathing? 0  Doing errands, shopping? 0  Preparing Food and eating ? N  Using the Toilet? N  In the past six months, have you accidently leaked urine? N  Do you have problems with loss of bowel control? N  Managing your Medications? N  Managing your Finances? N  Housekeeping or managing your Housekeeping? N    Patient Care Team: Gretta Comer POUR, NP as PCP - General (Internal Medicine) Obgyn, Anna  I have updated your Care Teams any recent Medical  Services you may have received from other providers in the past year.     Assessment:   This is a routine wellness examination for Valerie Sparks.  Hearing/Vision screen Hearing Screening - Comments:: No difficulties Vision Screening - Comments:: Wears readers   Goals Addressed             This Visit's Progress    Patient Stated   On track    Lose 25 pounds.       Depression Screen     08/14/2023    9:30 AM 05/09/2023    8:47 AM 08/10/2022    9:24 AM 03/09/2022    3:30 PM 08/05/2020    3:19 PM  PHQ 2/9 Scores  PHQ - 2 Score 2 0 0 0 0  PHQ- 9 Score 2        Fall Risk     08/14/2023    9:28 AM 05/09/2023    8:46 AM 08/10/2022    9:25 AM 03/09/2022    3:30 PM 08/05/2020    3:19 PM  Fall Risk   Falls in the past year? 0 0 0 0 0  Number falls in past yr: 0 0 0 0 0  Injury with Fall? 0 0 0 0   Risk for fall due to : No Fall Risks No Fall Risks No Fall Risks No Fall Risks   Follow up Falls evaluation completed Falls evaluation completed Falls prevention discussed;Falls evaluation completed Falls evaluation completed  Falls evaluation completed      Data saved with a previous flowsheet row definition    MEDICARE RISK AT HOME:  Medicare Risk at Home Any stairs in or around the home?: Yes If so, are there any without handrails?: No Home free of loose throw rugs in walkways, pet beds, electrical cords, etc?: Yes Adequate lighting in your home to reduce risk of falls?: Yes Life alert?: No Use of a cane, walker or w/c?: No Grab bars in the bathroom?: No Shower chair or bench in shower?: No Elevated toilet seat or a handicapped toilet?: No  TIMED UP AND GO:  Was the test performed?  No  Cognitive Function: 6CIT completed        08/14/2023    9:27  AM 08/10/2022    9:27 AM  6CIT Screen  What Year? 0 points 0 points  What month? 0 points 0 points  What time? 0 points 0 points  Count back from 20 0 points 0 points  Months in reverse 0 points 0 points  Repeat phrase 0 points  0 points  Total Score 0 points 0 points    Immunizations Immunization History  Administered Date(s) Administered   Influenza,inj,Quad PF,6+ Mos 12/14/2015, 12/06/2016, 01/17/2019   Td 02/15/2000   Tdap 05/10/2019    Screening Tests Health Maintenance  Topic Date Due   COVID-19 Vaccine (1) Never done   Hepatitis C Screening  Never done   Pneumococcal Vaccine: 50+ Years (1 of 2 - PCV) Never done   Zoster Vaccines- Shingrix (1 of 2) Never done   Colonoscopy  04/27/2023   INFLUENZA VACCINE  09/15/2023   Medicare Annual Wellness (AWV)  08/13/2024   MAMMOGRAM  03/16/2025   DTaP/Tdap/Td (3 - Td or Tdap) 05/09/2029   DEXA SCAN  Completed   Hepatitis B Vaccines  Aged Out   HPV VACCINES  Aged Out   Meningococcal B Vaccine  Aged Out    Health Maintenance  Health Maintenance Due  Topic Date Due   COVID-19 Vaccine (1) Never done   Hepatitis C Screening  Never done   Pneumococcal Vaccine: 50+ Years (1 of 2 - PCV) Never done   Zoster Vaccines- Shingrix (1 of 2) Never done   Colonoscopy  04/27/2023   Health Maintenance Items Addressed:patient declined health maintenance   Additional Screening:  Vision Screening: Recommended annual ophthalmology exams for early detection of glaucoma and other disorders of the eye. Would you like a referral to an eye doctor? No    Dental Screening: Recommended annual dental exams for proper oral hygiene  Community Resource Referral / Chronic Care Management: CRR required this visit?  No   CCM required this visit?  No   Plan:    I have personally reviewed and noted the following in the patient's chart:   Medical and social history Use of alcohol, tobacco or illicit drugs  Current medications and supplements including opioid prescriptions. Patient is not currently taking opioid prescriptions. Functional ability and status Nutritional status Physical activity Advanced directives List of other physicians Hospitalizations, surgeries, and  ER visits in previous 12 months Vitals Screenings to include cognitive, depression, and falls Referrals and appointments  In addition, I have reviewed and discussed with patient certain preventive protocols, quality metrics, and best practice recommendations. A written personalized care plan for preventive services as well as general preventive health recommendations were provided to patient.   Lyle MARLA Right, [place]   08/14/2023   After Visit Summary: (MyChart) Due to this being a telephonic visit, the after visit summary with patients personalized plan was offered to patient via MyChart   Notes: Nothing significant to report at this time.

## 2023-08-24 ENCOUNTER — Encounter: Payer: Self-pay | Admitting: Cardiovascular Disease

## 2023-08-24 DIAGNOSIS — I480 Paroxysmal atrial fibrillation: Secondary | ICD-10-CM

## 2023-08-28 ENCOUNTER — Other Ambulatory Visit: Payer: Self-pay | Admitting: Physician Assistant

## 2023-08-28 ENCOUNTER — Other Ambulatory Visit: Payer: Self-pay | Admitting: *Deleted

## 2023-08-28 DIAGNOSIS — I480 Paroxysmal atrial fibrillation: Secondary | ICD-10-CM

## 2023-08-29 ENCOUNTER — Ambulatory Visit: Payer: Self-pay | Admitting: Physician Assistant

## 2023-08-29 LAB — CBC
Hematocrit: 45.4 % (ref 34.0–46.6)
Hemoglobin: 15 g/dL (ref 11.1–15.9)
MCH: 28.1 pg (ref 26.6–33.0)
MCHC: 33 g/dL (ref 31.5–35.7)
MCV: 85 fL (ref 79–97)
Platelets: 269 x10E3/uL (ref 150–450)
RBC: 5.33 x10E6/uL — ABNORMAL HIGH (ref 3.77–5.28)
RDW: 13.2 % (ref 11.7–15.4)
WBC: 6.8 x10E3/uL (ref 3.4–10.8)

## 2023-08-29 NOTE — Telephone Encounter (Signed)
 See CBC in system for preop anticoag rec.

## 2023-08-30 ENCOUNTER — Ambulatory Visit: Attending: Cardiology | Admitting: Emergency Medicine

## 2023-08-30 DIAGNOSIS — Z01810 Encounter for preprocedural cardiovascular examination: Secondary | ICD-10-CM | POA: Diagnosis not present

## 2023-08-30 NOTE — Progress Notes (Signed)
 Virtual Visit via Telephone Note   Because of [name] co-morbid illnesses, she is at least at moderate risk for complications without adequate follow up.  This format is felt to be most appropriate for this patient at this time.  Due to technical limitations with video connection (technology), today's appointment will be conducted as an audio only telehealth visit, and Valerie Sparks verbally agreed to proceed in this manner.   All issues noted in this document were discussed and addressed.  No physical exam could be performed with this format.  Evaluation Performed:  Preoperative cardiovascular risk assessment _____________   Date:  08/30/2023   Patient ID:  Valerie Sparks, DOB [birth year], MRN [id number] Patient Location:  Home Provider location:   Office  Primary Care Provider:  Gretta Comer POUR, NP Primary Cardiologist:  None  Chief Complaint / Patient Profile   68 y.o. y/o female with a h/o paroxysmal atrial fibrillation, mixed hyperlipidemia, hypertension, prediabetes who is pending colonoscopy with [facility] on 09/19/2023 and presents today for telephonic preoperative cardiovascular risk assessment.  History of Present Illness    Valerie Sparks is a 68 y.o. female who presents via audio/video conferencing for a telehealth visit today.  Pt was last seen in cardiology clinic on 06/16/2023 by Tylene Lunch, NP.  At that time Hailley Byers was doing well.  The patient is now pending procedure as outlined above. Since her last visit, she denies chest pain, shortness of breath, lower extremity edema, fatigue, palpitations, melena, hematuria, hemoptysis, diaphoresis, weakness, presyncope, syncope, orthopnea, and PND.  Today patient is doing well overall.  She is without acute cardiovascular concerns or complaints.  She denies any anginal symptoms or exertional symptoms.  She is without any chest pain or dyspnea.  She does note she continues to have  episodes of breakthrough atrial fibrillation which she monitors on her Apple Watch.  This occurs about twice a week of short duration.  Over the past year she has only had to use her as needed metoprolol  twice.  She reports her fluid status is stable.  At this time she feels like her breakthrough atrial fibrillation is stable and has not worsened.  She reports symptoms are unchanged over the past year.  At this time she is not interested in EP referral for further evaluation as she feels like is currently well-managed.  She does maintain a relatively active lifestyle.  She various indoor activities without exertional symptoms.  Her blood pressure is well-controlled at home.  Overall she is able to complete greater than 4 METS. Past Medical History    Past Medical History:  Diagnosis Date   Allergy    Frequent headaches    Herpes zoster without complication 12/03/2018   Hyperlipidemia    Past Surgical History:  Procedure Laterality Date   ABLATION     BUNIONECTOMY     right   TOOTH EXTRACTION     TUBAL LIGATION      Allergies  Allergies  Allergen Reactions   Actifed Cold-Allergy [Chlorpheniramine-Phenyleph Er]    Pseudoephedrine Other (See Comments)    takes me out of my skin   Simvastatin      Joint pain    Home Medications    Prior to Admission medications   Medication Sig Start Date End Date Taking? Authorizing Provider  Blood Pressure Monitoring (BLOOD PRESSURE CUFF) MISC Check blood pressure as instructed by your physician 06/16/23   Lunch Tylene, NP  Blood Pressure Monitoring (OMRON 3 SERIES  BP MONITOR) DEVI use as directed to check blood pressure 06/16/23   Hammock, Tylene, NP  ezetimibe  (ZETIA ) 10 MG tablet Take 1 tablet (10 mg total) by mouth daily. 09/30/22 08/14/23  Gollan, Timothy J, MD  furosemide  (LASIX ) 20 MG tablet Take 1 tablet (20 mg total) by mouth daily as needed. 06/16/23 09/14/23  Gerard Tylene, NP  ibuprofen  (ADVIL ) 800 MG tablet Take 1 tablet (800 mg total) by  mouth daily as needed for moderate pain. 05/10/19   Clark, Katherine K, NP  metoprolol  succinate (TOPROL -XL) 50 MG 24 hr tablet Take 1 tablet (50 mg total) by mouth in the morning and at bedtime. Take with or immediately following a meal. 09/30/22 08/14/23  Gollan, Timothy J, MD  potassium chloride  SA (KLOR-CON  M) 20 MEQ tablet Take 1 tablet (20 mEq total) by mouth daily as needed. 06/16/23   Gerard Tylene, NP  rivaroxaban  (XARELTO ) 20 MG TABS tablet TAKE 1 TABLET BY MOUTH DAILY WITH SUPPER. 08/08/23   Gollan, Timothy J, MD  rosuvastatin  (CRESTOR ) 10 MG tablet Take 1 tablet (10 mg total) by mouth every other day. 09/30/22 08/14/23  Perla Evalene PARAS, MD    Physical Exam    Vital Signs:  Rumalda Earnie Lares does not have vital signs available for review today.  Given telephonic nature of communication, physical exam is limited. AAOx3. NAD. Normal affect.  Speech and respirations are unlabored.  Accessory Clinical Findings    None  Assessment & Plan    1.  Preoperative Cardiovascular Risk Assessment: According to the Revised Cardiac Risk Index (RCRI), her Perioperative Risk of Major Cardiac Event is (%): 0.4. Her Functional Capacity in METs is: 5.07 according to the Duke Activity Status Index (DASI). Therefore, based on ACC/AHA guidelines, patient would be at acceptable risk for the planned procedure without further cardiovascular testing. I will route this recommendation to the requesting party via Epic fax function.  The patient was advised that if she develops new symptoms prior to surgery to contact our office to arrange for a follow-up visit, and she verbalized understanding.  Patient can hold Xarelto  for 2 days prior to procedure.   A copy of this note will be routed to requesting surgeon.  Time:   Today, I have spent 15 minutes with the patient with telehealth technology discussing medical history, symptoms, and management plan.     Lum LITTIE Louis, NP  08/30/2023, 2:22 PM

## 2023-08-30 NOTE — Telephone Encounter (Signed)
 Plt 269 CrCl 94 ml/min Patient can hold Xarelto  for 2 days prior to procedure.

## 2023-08-30 NOTE — Telephone Encounter (Signed)
 I spoke to Valerie Sparks and I advised her that she is cleared to hold her Xarelto  2 days before her procedure.  She said that she will notify us  if she has any issues like A-Fib before her procedure.

## 2023-09-11 ENCOUNTER — Encounter: Payer: Self-pay | Admitting: Internal Medicine

## 2023-09-13 ENCOUNTER — Other Ambulatory Visit: Payer: Self-pay | Admitting: Cardiovascular Disease

## 2023-09-18 NOTE — Progress Notes (Unsigned)
 [facility] History and Physical   Primary Care Physician:  Gretta Comer POUR, NP   Reason for Procedure:    Encounter Diagnosis  Name Primary?   Colon cancer screening Yes     Plan:    colonoscopy     HPI: Valerie Sparks is a 68 y.o. female with a history of atrial fibrillation on anticoagulation, presenting for a screening colonoscopy examination.  She was seen in the clinic in June and the procedure was arranged, cardiology has reviewed her situation and had a telehealth visit and cleared holding anticoagulation.  She did have an episode of rectal bleeding attributed to a hemorrhoid recently.   Past Medical History:  Diagnosis Date   Allergy    Frequent headaches    Herpes zoster without complication 12/03/2018   Hyperlipidemia     Past Surgical History:  Procedure Laterality Date   ABLATION     BUNIONECTOMY     right   TOOTH EXTRACTION     TUBAL LIGATION       Current Outpatient Medications  Medication Sig Dispense Refill   Blood Pressure Monitoring (BLOOD PRESSURE CUFF) MISC Check blood pressure as instructed by your physician 1 each 0   Blood Pressure Monitoring (OMRON 3 SERIES BP MONITOR) DEVI use as directed to check blood pressure 1 each 0   ezetimibe  (ZETIA ) 10 MG tablet TAKE 1 TABLET BY MOUTH EVERY DAY 90 tablet 3   furosemide  (LASIX ) 20 MG tablet Take 1 tablet (20 mg total) by mouth daily as needed. 180 tablet 3   ibuprofen  (ADVIL ) 800 MG tablet Take 1 tablet (800 mg total) by mouth daily as needed for moderate pain. 90 tablet 0   metoprolol  succinate (TOPROL -XL) 50 MG 24 hr tablet TAKE 1 TABLET BY MOUTH IN THE MORNING AND AT BEDTIME. TAKE WITH OR IMMEDIATELY FOLLOWING A MEAL. 180 tablet 3   potassium chloride  SA (KLOR-CON  M) 20 MEQ tablet Take 1 tablet (20 mEq total) by mouth daily as needed. 90 tablet 3   rivaroxaban  (XARELTO ) 20 MG TABS tablet TAKE 1 TABLET BY MOUTH DAILY WITH SUPPER. 90 tablet 1   rosuvastatin  (CRESTOR ) 10 MG tablet  Take 1 tablet (10 mg total) by mouth every other day. 45 tablet 3   No current facility-administered medications for this visit.    Allergies as of 09/19/2023 - Review Complete 08/30/2023  Allergen Reaction Noted   Actifed cold-allergy [chlorpheniramine-phenyleph er]  04/26/2013   Pseudoephedrine Other (See Comments)    Simvastatin   06/29/2022    Family History  Problem Relation Age of Onset   Hypertension Mother    Diabetes Mother    Heart disease Father    Diabetes Sister    Breast cancer Daughter 43   Breast cancer Paternal Aunt 58   Cancer Maternal Grandmother    Heart attack Paternal Grandfather    Kidney disease Paternal Grandfather    Diabetes Brother    Colon cancer Neg Hx    Esophageal cancer Neg Hx    Rectal cancer Neg Hx    Stomach cancer Neg Hx     Social History   Socioeconomic History   Marital status: Married    Spouse name: Not on file   Number of children: 1   Years of education: Not on file   Highest education level: 12th grade  Occupational History   Not on file  Tobacco Use   Smoking status: Never   Smokeless tobacco: Never  Vaping Use   Vaping status: Never  Used  Substance and Sexual Activity   Alcohol use: Yes    Comment: very rarely   Drug use: No   Sexual activity: Yes  Other Topics Concern   Not on file  Social History Narrative   Not on file   Social Drivers of Health   Financial Resource Strain: Low Risk  (08/14/2023)   Overall Financial Resource Strain (CARDIA)    Difficulty of Paying Living Expenses: Not hard at all  Food Insecurity: No Food Insecurity (08/14/2023)   Hunger Vital Sign    Worried About Running Out of Food in the Last Year: Never true    Ran Out of Food in the Last Year: Never true  Transportation Needs: No Transportation Needs (08/14/2023)   PRAPARE - Administrator, Civil Service (Medical): No    Lack of Transportation (Non-Medical): No  Physical Activity: Insufficiently Active (08/14/2023)    Exercise Vital Sign    Days of Exercise per Week: 3 days    Minutes of Exercise per Session: 30 min  Stress: No Stress Concern Present (08/14/2023)   [employer] of Occupational Health - Occupational Stress Questionnaire    Feeling of Stress: Not at all  Social Connections: Moderately Integrated (08/14/2023)   Social Connection and Isolation Panel    Frequency of Communication with Friends and Family: More than three times a week    Frequency of Social Gatherings with Friends and Family: Twice a week    Attends Religious Services: 1 to 4 times per year    Active Member of [employer] or Organizations: No    Attends Banker Meetings: Never    Marital Status: Married  Catering manager Violence: Not At Risk (08/14/2023)   Humiliation, Afraid, Rape, and Kick questionnaire    Fear of Current or Ex-Partner: No    Emotionally Abused: No    Physically Abused: No    Sexually Abused: No    Review of Systems: Positive for *** All other review of systems negative except as mentioned in the HPI.  Physical Exam: Vital signs There were no vitals taken for this visit.  General:   Alert,  Well-developed, well-nourished, pleasant and cooperative in NAD Lungs:  Clear throughout to auscultation.   Heart:  Regular rate and rhythm; no murmurs, clicks, rubs,  or gallops. Abdomen:  Soft, nontender and nondistended. Normal bowel sounds.   Neuro/Psych:  Alert and cooperative. Normal mood and affect. A and O x 3   @Daphna Lafuente  CHARLENA Commander, MD, [facility] [phone] (pager) 09/18/2023 7:09 PM@

## 2023-09-19 ENCOUNTER — Ambulatory Visit (AMBULATORY_SURGERY_CENTER): Admitting: Internal Medicine

## 2023-09-19 ENCOUNTER — Encounter: Payer: Self-pay | Admitting: Internal Medicine

## 2023-09-19 VITALS — BP 118/61 | HR 54 | Temp 98.1°F | Resp 17 | Ht 62.0 in | Wt 183.0 lb

## 2023-09-19 DIAGNOSIS — K648 Other hemorrhoids: Secondary | ICD-10-CM | POA: Diagnosis not present

## 2023-09-19 DIAGNOSIS — K5730 Diverticulosis of large intestine without perforation or abscess without bleeding: Secondary | ICD-10-CM

## 2023-09-19 DIAGNOSIS — D120 Benign neoplasm of cecum: Secondary | ICD-10-CM

## 2023-09-19 DIAGNOSIS — K635 Polyp of colon: Secondary | ICD-10-CM

## 2023-09-19 DIAGNOSIS — I4891 Unspecified atrial fibrillation: Secondary | ICD-10-CM | POA: Diagnosis not present

## 2023-09-19 DIAGNOSIS — Z1211 Encounter for screening for malignant neoplasm of colon: Secondary | ICD-10-CM | POA: Diagnosis not present

## 2023-09-19 DIAGNOSIS — K644 Residual hemorrhoidal skin tags: Secondary | ICD-10-CM

## 2023-09-19 DIAGNOSIS — Z7901 Long term (current) use of anticoagulants: Secondary | ICD-10-CM | POA: Diagnosis not present

## 2023-09-19 MED ORDER — SODIUM CHLORIDE 0.9 % IV SOLN: 500.0000 mL | Freq: Once | INTRAVENOUS | Status: DC

## 2023-09-19 NOTE — Op Note (Signed)
 [facility] Patient Name: Valerie Sparks Procedure Date: 09/19/2023 10:30 AM MRN: [id number] Endoscopist: Lupita FORBES Commander , MD, [id number] Age: 68 Referring MD:  Date of Birth: [birth year] Gender: Female Account #: [id number] Procedure:                Colonoscopy Indications:              Screening for colorectal malignant neoplasm, Last                            colonoscopy: 2015 Medicines:                Monitored Anesthesia Care Procedure:                Pre-Anesthesia Assessment:                           - Prior to the procedure, a History and Physical                            was performed, and patient medications and                            allergies were reviewed. The patient's tolerance of                            previous anesthesia was also reviewed. The risks                            and benefits of the procedure and the sedation                            options and risks were discussed with the patient.                            All questions were answered, and informed consent                            was obtained. Prior Anticoagulants: The patient                            last took Xarelto  (rivaroxaban ) 3 days prior to the                            procedure. ASA Grade Assessment: III - A patient                            with severe systemic disease. After reviewing the                            risks and benefits, the patient was deemed in                            satisfactory condition to undergo the procedure.  After obtaining informed consent, the colonoscope                            was passed under direct vision. Throughout the                            procedure, the patient's blood pressure, pulse, and                            oxygen saturations were monitored continuously. The                            [id number]-HQ190L Colonoscope [id number] was introduced                            through the anus and advanced  to the the cecum,                            identified by appendiceal orifice and ileocecal                            valve. The colonoscopy was performed without                            difficulty. The patient tolerated the procedure                            well. The quality of the bowel preparation was                            good. The ileocecal valve, appendiceal orifice, and                            rectum were photographed. The bowel preparation                            used was Miralax via split dose instruction. Scope In: 10:44:30 AM Scope Out: 11:04:52 AM Scope Withdrawal Time: 0 hours 12 minutes 57 seconds  Total Procedure Duration: 0 hours 20 minutes 22 seconds  Findings:                 Hemorrhoids were found on perianal exam.                           A 5 mm polyp was found in the cecum. The polyp was                            sessile. The polyp was removed with a cold snare.                            Resection and retrieval were complete. Verification                            of patient identification for  the specimen was                            done. Estimated blood loss was minimal.                           Multiple medium-mouthed diverticula were found in                            the sigmoid colon.                           Internal hemorrhoids were found. The hemorrhoids                            were small.                           The exam was otherwise without abnormality on                            direct and retroflexion views. Complications:            No immediate complications. Estimated Blood Loss:     Estimated blood loss was minimal. Impression:               - Hemorrhoids found on perianal exam. Large                            external hemorrhoids                           - One 5 mm polyp in the cecum, removed with a cold                            snare. Resected and retrieved.                           - Diverticulosis in the  sigmoid colon.                           - Internal hemorrhoids. Small                           - The examination was otherwise normal on direct                            and retroflexion views. Recommendation:           - Patient has a contact number available for                            emergencies. The signs and symptoms of potential                            delayed complications were discussed with the  patient. Return to normal activities tomorrow.                            Written discharge instructions were provided to the                            patient.                           - Resume previous diet.                           - Continue present medications.                           - Resume Xarelto  (rivaroxaban ) at prior dose                            tomorrow. Lupita FORBES Commander, MD 09/19/2023 11:13:28 AM This report has been signed electronically.

## 2023-09-19 NOTE — Progress Notes (Signed)
 Called to room to assist during endoscopic procedure.  Patient ID and intended procedure confirmed with present staff. Received instructions for my participation in the procedure from the performing physician.

## 2023-09-19 NOTE — Progress Notes (Signed)
 Vss nad trans to pacu

## 2023-09-19 NOTE — Progress Notes (Signed)
 Updated medical record.

## 2023-09-19 NOTE — Patient Instructions (Addendum)
 I found and removed one small polyp that looks benign. I will let you know pathology results and when to have another routine colonoscopy by mail and/or My Chart.  You have diverticulosis - thickened muscle rings and pouches in the colon wall. Please read the handout about this condition. Hemorrhoids also seen as you know.  Resume Xarelto  tomorrow.  I appreciate the opportunity to care for you. Lupita CHARLENA Commander, MD, [facility]  Resume previous diet. Continue present medications. Resume Xarelto  (rivaroxaban ) at prior dose tomorrow.   YOU HAD AN ENDOSCOPIC PROCEDURE TODAY AT THE [facility]:   Refer to the procedure report that was given to you for any specific questions about what was found during the examination.  If the procedure report does not answer your questions, please call your gastroenterologist to clarify.  If you requested that your care partner not be given the details of your procedure findings, then the procedure report has been included in a sealed envelope for you to review at your convenience later.  YOU SHOULD EXPECT: Some feelings of bloating in the abdomen. Passage of more gas than usual.  Walking can help get rid of the air that was put into your GI tract during the procedure and reduce the bloating. If you had a lower endoscopy (such as a colonoscopy or flexible sigmoidoscopy) you may notice spotting of blood in your stool or on the toilet paper. If you underwent a bowel prep for your procedure, you may not have a normal bowel movement for a few days.  Please Note:  You might notice some irritation and congestion in your nose or some drainage.  This is from the oxygen used during your procedure.  There is no need for concern and it should clear up in a day or so.  SYMPTOMS TO REPORT IMMEDIATELY:  Following lower endoscopy (colonoscopy or flexible sigmoidoscopy):  Excessive amounts of blood in the stool  Significant tenderness or worsening of abdominal  pains  Swelling of the abdomen that is new, acute  Fever of 100F or higher  For urgent or emergent issues, a gastroenterologist can be reached at any hour by calling [phone]. Do not use MyChart messaging for urgent concerns.    DIET:  We do recommend a small meal at first, but then you may proceed to your regular diet.  Drink plenty of fluids but you should avoid alcoholic beverages for 24 hours.  ACTIVITY:  You should plan to take it easy for the rest of today and you should NOT DRIVE or use heavy machinery until tomorrow (because of the sedation medicines used during the test).    FOLLOW UP: Our staff will call the number listed on your records the next business day following your procedure.  We will call around 7:15- 8:00 am to check on you and address any questions or concerns that you may have regarding the information given to you following your procedure. If we do not reach you, we will leave a message.     If any biopsies were taken you will be contacted by phone or by letter within the next 1-3 weeks.  Please call us  at [phone] if you have not heard about the biopsies in 3 weeks.    SIGNATURES/CONFIDENTIALITY: You and/or your care partner have signed paperwork which will be entered into your electronic medical record.  These signatures attest to the fact that that the information above on your After Visit Summary has been reviewed and is understood.  Full responsibility of the confidentiality of this discharge information lies with you and/or your care-partner.

## 2023-09-20 ENCOUNTER — Telehealth: Payer: Self-pay | Admitting: *Deleted

## 2023-09-20 NOTE — Telephone Encounter (Signed)
  Follow up Call-     09/19/2023    9:29 AM  Call back number  Post procedure Call Back phone  # [phone]  Permission to leave phone message Yes     Patient questions:  Do you have a fever, pain , or abdominal swelling? No. Pain Score  0 *  Have you tolerated food without any problems? Yes.    Have you been able to return to your normal activities? Yes.    Do you have any questions about your discharge instructions: Diet   No. Medications  No. Follow up visit  No.  Do you have questions or concerns about your Care? No.  Actions: * If pain score is 4 or above: No action needed, pain <4.

## 2023-09-21 LAB — SURGICAL PATHOLOGY

## 2023-09-24 ENCOUNTER — Encounter: Payer: Self-pay | Admitting: Internal Medicine

## 2023-09-24 ENCOUNTER — Ambulatory Visit: Payer: Self-pay | Admitting: Internal Medicine

## 2023-09-24 DIAGNOSIS — Z860100 Personal history of colon polyps, unspecified: Secondary | ICD-10-CM | POA: Insufficient documentation

## 2023-10-11 ENCOUNTER — Other Ambulatory Visit: Payer: Self-pay | Admitting: Cardiovascular Disease

## 2023-10-22 ENCOUNTER — Other Ambulatory Visit: Payer: Self-pay | Admitting: Cardiovascular Disease

## 2023-11-30 ENCOUNTER — Ambulatory Visit: Attending: Cardiology | Admitting: Cardiology

## 2023-11-30 ENCOUNTER — Encounter: Payer: Self-pay | Admitting: Cardiology

## 2023-11-30 VITALS — BP 120/70 | HR 55 | Ht 62.0 in | Wt 182.0 lb

## 2023-11-30 DIAGNOSIS — I361 Nonrheumatic tricuspid (valve) insufficiency: Secondary | ICD-10-CM

## 2023-11-30 DIAGNOSIS — I480 Paroxysmal atrial fibrillation: Secondary | ICD-10-CM | POA: Diagnosis not present

## 2023-11-30 DIAGNOSIS — I10 Essential (primary) hypertension: Secondary | ICD-10-CM

## 2023-11-30 DIAGNOSIS — R7303 Prediabetes: Secondary | ICD-10-CM | POA: Diagnosis not present

## 2023-11-30 DIAGNOSIS — Z79899 Other long term (current) drug therapy: Secondary | ICD-10-CM

## 2023-11-30 DIAGNOSIS — E782 Mixed hyperlipidemia: Secondary | ICD-10-CM | POA: Diagnosis not present

## 2023-11-30 DIAGNOSIS — I340 Nonrheumatic mitral (valve) insufficiency: Secondary | ICD-10-CM | POA: Diagnosis not present

## 2023-11-30 DIAGNOSIS — R0602 Shortness of breath: Secondary | ICD-10-CM | POA: Diagnosis not present

## 2023-11-30 NOTE — Patient Instructions (Signed)
 Medication Instructions:  Your physician recommends that you continue on your current medications as directed. Please refer to the Current Medication list given to you today.    *If you need a refill on your cardiac medications before your next appointment, please call your pharmacy*  Lab Work: Your provider would like for you to have following labs drawn today BMP, CBC, MG, TSH.     Testing/Procedures: Your physician has requested that you have an echocardiogram. Echocardiography is a painless test that uses sound waves to create images of your heart. It provides your doctor with information about the size and shape of your heart and how well your heart's chambers and valves are working.   You may receive an ultrasound enhancing agent through an IV if needed to better visualize your heart during the echo. This procedure takes approximately one hour.  There are no restrictions for this procedure.  This will take place at [address] [facility], [place] [address]  Please note: We ask at that you not bring children with you during ultrasound (echo/ vascular) testing. Due to room size and safety concerns, children are not allowed in the ultrasound rooms during exams. Our front office staff cannot provide observation of children in our lobby area while testing is being conducted. An adult accompanying a patient to their appointment will only be allowed in the ultrasound room at the discretion of the ultrasound technician under special circumstances. We apologize for any inconvenience.   Follow-Up: At [facility], you and your health needs are our priority.  As part of our continuing mission to provide you with exceptional heart care, our providers are all part of one team.  This team includes your primary Cardiologist (physician) and Advanced Practice Providers or APPs (Physician Assistants and Nurse Practitioners) who all work together to provide you with the care  you need, when you need it.  Your next appointment:   3 month(s)  Provider:   Tylene Lunch, NP     BMP, CBC, MG, TSH  ECHO SOB TR moder MR  3 mo

## 2023-11-30 NOTE — Progress Notes (Signed)
 Cardiology Office Note   Date:  11/30/2023  ID:  Valerie Sparks, DOB Mar 08, 1955, MRN [id number] PCP: Valerie Comer POUR, NP  [facility] Providers Cardiologist:  None     History of Present Illness Valerie Sparks is a 68 y.o. female with a past medical history paroxysmal atrial fibrillation, mixed hyperlipidemia, elevated blood pressures without the diagnosis of hypertension, prediabetes, who is here today for follow-up.   Prior ZIO monitor revealed 41% burden of atrial fibrillation with average heart rate 107 bpm. Echocardiogram July 2024 with normal left and right ventricular size and function, mildly elevated right heart pressures, moderate mitral regurgitation, moderate to severe tricuspid valve regurgitation.  She subsequently started on furosemide  20 mg daily with potassium 20 mEq daily.  She was placed on rosuvastatin  and started developing hand cramps.  She continued to have paroxysmal tachycardia and paroxysmal atrial fibrillation.  She was started on metoprolol  tartrate and was having regular meals overnight.  She continued to use Apple Watch to monitor for heart arrhythmias.  Previously seen in clinic 09/30/2022 by Dr. Gollan.  She had had several episodes of breakthrough atrial fibrillation but they have been improving.  She was changed from metoprolol  to tartrate to succinate 50 mg twice daily continued on Xarelto  20 mg daily.  Rosuvastatin  was decreased to 10 mg every other day and she was continued on ezetimibe  10 mg daily.   She was last seen in clinic 06/16/2023 for the most part been doing fairly well.  She continued to have occasional bouts of atrial fibrillation.  She had not needed any of her breakthrough metoprolol .  She also stated that she would like to come off of her cholesterol medication.  Unfortunately prior to her appointment she had not had any of her home medications.  She continue to monitor her heart rate with her Apple watch.  Denied any recent  hospitalizations or visits to the emergency department.  Returns to clinic today stating that she had done fairly well up until this past Saturday and Sunday when she started having dizziness and believes that she may pass out her Apple watch alerted her that she had elevated heart rates.  She also had associated symptoms of shortness of breath and had to use extra metoprolol .  Since that time her heart rate has been reduced and she has been in the 50s and 60s.  Her associated symptoms have resolved.  She denies any recurrent chest pain or shortness of breath related to that.  She has noted that she has been under increased amount of stress, and lack of sleep, has noted some additional swelling to her bilateral lower extremities, increased palpitations, and shortness of breath when walking with her husband.  She states that she has been compliant with her current medication regimen with no missed doses of her Xarelto .  She has also noted no issues with bleeding with no blood noted in her stool or urine.  She also states that she has had no hospitalization or visits to the emergency department.  ROS: 10 point review systems has been reviewed and considered negative exception was listed in HPI  Studies Reviewed EKG Interpretation Date/Time:  Thursday November 30 2023 08:25:04 EDT Ventricular Rate:  55 PR Interval:  152 QRS Duration:  80 QT Interval:  446 QTC Calculation: 426 R Axis:   21  Text Interpretation: Sinus bradycardia When compared with ECG of 16-Jun-2023 08:04, No significant change was found Confirmed by Valerie Sparks ([id number]) on 11/30/2023 8:28:16 AM  2D echo 08/24/2022  1. Left ventricular ejection fraction, by estimation, is 60 to 65%. The  left ventricle has normal function. The left ventricle has no regional  wall motion abnormalities. Left ventricular diastolic parameters were  normal.   2. Right ventricular systolic function is normal. The right ventricular  size is normal.  There is mildly elevated pulmonary artery systolic  pressure. The estimated right ventricular systolic pressure is 41.2 mmHg.   3. The mitral valve is normal in structure. Moderate mitral valve  regurgitation. No evidence of mitral stenosis.   4. Tricuspid valve regurgitation is moderate to severe.   5. The aortic valve is normal in structure. Aortic valve regurgitation is  not visualized. Aortic valve sclerosis is present, with no evidence of  aortic valve stenosis.   6. The inferior vena cava is normal in size with greater than 50%  respiratory variability, suggesting right atrial pressure of 3 mmHg.    Event Monitor (Zio) 06/28/2022 HR 48 - 234 bpm, average 84 bpm. 1 nonsustained VT lasting 7 beats. 41% AF burden, average HR 107 bpm Symptom triggers correspond to AF. Rare ventricular ectopy.   Urgent cardiology referral placed for new diagnosis AF.   Risk Assessment/Calculations  CHA2DS2-VASc Score = 3   This indicates a 3.2% annual risk of stroke. The patient's score is based upon: CHF History: 0 HTN History: 1 Diabetes History: 0 Stroke History: 0 Vascular Disease History: 0 Age Score: 1 Gender Score: 1            Physical Exam VS:  BP 120/70 (BP Location: Left Arm, Patient Position: Sitting, Cuff Size: Normal)   Pulse (!) 55   Ht 5' 2 (1.575 m)   Wt 182 lb (82.6 kg)   SpO2 100%   BMI 33.29 kg/m        Wt Readings from Last 3 Encounters:  11/30/23 182 lb (82.6 kg)  09/19/23 183 lb (83 kg)  08/14/23 178 lb (80.7 kg)    GEN: Well nourished, well developed in no acute distress NECK: No JVD; No carotid bruits CARDIAC: RRR, bradycardic, no murmurs, rubs, gallops RESPIRATORY:  Clear to auscultation without rales, wheezing or rhonchi  ABDOMEN: Soft, non-tender, non-distended EXTREMITIES:  No edema; No deformity   ASSESSMENT AND PLAN Paroxysmal atrial fibrillation which is maintaining sinus bradycardia today noted on EKG with a rate of 55 with no acute ischemic  changes noted from prior studies.  She has had several episodes since last weekend which required extra doses of metoprolol  when her watch alerted her that she was in atrial fibrillation with rates from 80-140.  Since taking extra metoprolol  she has not had any further bouts of atrial fibrillation.  She is continued on rivaroxaban  20 mg daily for CHA2DS2-VASc score of at least 3 for stroke prophylaxis today she has been sent for updated labs of BMP CBC mag and TSH.  Continue to discuss triggers of atrial fibrillation today and since she has symptomatic.  Since she has not staying in A-fib for prolonged time a cardioversion is not an option.  Discussed that if she continues to stay in and out of atrial fibrillation it may be beneficial to see EP for further antiarrhythmic or potential ablation procedure.  Mixed hyperlipidemia with last LDL 78.  She is continued on ezetimibe  10 mg daily and rosuvastatin  10 mg every other day.  Ongoing management per PCP.  Primary hypertension with a blood pressure of 120/70.  She has been continued on Toprol -XL 50 mg twice  daily and furosemide  20 mg twice daily.  She has been encouraged to continue to monitor pressures 1 to 2 hours postmedication administration as well.  Prediabetes weight loss hemoglobin A1c was 6.3.  She has been increasing her activity ongoing management per PCP.  Shortness of breath with peripheral edema previously she had severe tricuspid regurgitation and moderate mitral regurgitation.  With her change in symptoms she has been scheduled for an updated echocardiogram.       Dispo: Patient to return to clinic see MD/APP in 3 months or sooner if needed for reevaluation  Signed, Sharee Sturdy, NP

## 2023-12-01 LAB — CBC
Hematocrit: 46.4 % (ref 34.0–46.6)
Hemoglobin: 15 g/dL (ref 11.1–15.9)
MCH: 28.2 pg (ref 26.6–33.0)
MCHC: 32.3 g/dL (ref 31.5–35.7)
MCV: 87 fL (ref 79–97)
Platelets: 250 x10E3/uL (ref 150–450)
RBC: 5.32 x10E6/uL — ABNORMAL HIGH (ref 3.77–5.28)
RDW: 13.4 % (ref 11.7–15.4)
WBC: 5.1 x10E3/uL (ref 3.4–10.8)

## 2023-12-01 LAB — BASIC METABOLIC PANEL WITH GFR
BUN/Creatinine Ratio: 19 (ref 12–28)
BUN: 15 mg/dL (ref 8–27)
CO2: 22 mmol/L (ref 20–29)
Calcium: 9.4 mg/dL (ref 8.7–10.3)
Chloride: 103 mmol/L (ref 96–106)
Creatinine, Ser: 0.77 mg/dL (ref 0.57–1.00)
Glucose: 99 mg/dL (ref 70–99)
Potassium: 4.1 mmol/L (ref 3.5–5.2)
Sodium: 140 mmol/L (ref 134–144)
eGFR: 84 mL/min/1.73 (ref >59)

## 2023-12-01 LAB — MAGNESIUM: Magnesium: 2 mg/dL (ref 1.6–2.3)

## 2023-12-01 LAB — TSH: TSH: 2.19 u[IU]/mL (ref 0.450–4.500)

## 2023-12-04 ENCOUNTER — Ambulatory Visit: Payer: Self-pay | Admitting: Cardiology

## 2023-12-04 NOTE — Progress Notes (Signed)
 Blood work has remained stable.  Recommend continuing current medication regimen without changes needed at this time.

## 2023-12-20 ENCOUNTER — Ambulatory Visit

## 2024-01-15 ENCOUNTER — Ambulatory Visit: Attending: Cardiology

## 2024-01-15 DIAGNOSIS — I361 Nonrheumatic tricuspid (valve) insufficiency: Secondary | ICD-10-CM | POA: Diagnosis not present

## 2024-01-15 DIAGNOSIS — R0602 Shortness of breath: Secondary | ICD-10-CM

## 2024-01-15 DIAGNOSIS — I340 Nonrheumatic mitral (valve) insufficiency: Secondary | ICD-10-CM

## 2024-01-15 LAB — ECHOCARDIOGRAM COMPLETE
AR max vel: 1.49 cm2
AV Area VTI: 1.45 cm2
AV Area mean vel: 1.48 cm2
AV Mean grad: 5 mmHg
AV Peak grad: 8.4 mmHg
Ao pk vel: 1.45 m/s
Area-P 1/2: 5.02 cm2
S' Lateral: 2.8 cm

## 2024-01-19 ENCOUNTER — Other Ambulatory Visit (HOSPITAL_BASED_OUTPATIENT_CLINIC_OR_DEPARTMENT_OTHER): Payer: Self-pay

## 2024-02-05 ENCOUNTER — Other Ambulatory Visit: Payer: Self-pay | Admitting: Cardiovascular Disease

## 2024-02-05 DIAGNOSIS — I480 Paroxysmal atrial fibrillation: Secondary | ICD-10-CM

## 2024-02-05 NOTE — Telephone Encounter (Signed)
 Prescription refill request for Xarelto  received.  Indication: PAF Last office visit: 11/30/23  GORMAN Lunch NP Weight: 82.6kg Age: 68 Scr: 0.77 on 11/30/23  Epic CrCl: 92.45  Based on above findings Xarelto  20mg  daily is the appropriate dose.  Refill approved.

## 2024-03-05 ENCOUNTER — Ambulatory Visit: Attending: Cardiology | Admitting: Cardiology

## 2024-03-05 ENCOUNTER — Encounter: Payer: Self-pay | Admitting: Cardiology

## 2024-03-05 ENCOUNTER — Ambulatory Visit

## 2024-03-05 VITALS — BP 128/80 | HR 50 | Ht 62.0 in | Wt 187.1 lb

## 2024-03-05 DIAGNOSIS — I10 Essential (primary) hypertension: Secondary | ICD-10-CM

## 2024-03-05 DIAGNOSIS — I4891 Unspecified atrial fibrillation: Secondary | ICD-10-CM

## 2024-03-05 DIAGNOSIS — E782 Mixed hyperlipidemia: Secondary | ICD-10-CM | POA: Diagnosis not present

## 2024-03-05 DIAGNOSIS — R0602 Shortness of breath: Secondary | ICD-10-CM | POA: Diagnosis not present

## 2024-03-05 DIAGNOSIS — R002 Palpitations: Secondary | ICD-10-CM

## 2024-03-05 DIAGNOSIS — R7303 Prediabetes: Secondary | ICD-10-CM

## 2024-03-05 DIAGNOSIS — I480 Paroxysmal atrial fibrillation: Secondary | ICD-10-CM | POA: Diagnosis not present

## 2024-03-05 DIAGNOSIS — Z79899 Other long term (current) drug therapy: Secondary | ICD-10-CM | POA: Diagnosis not present

## 2024-03-05 DIAGNOSIS — R600 Localized edema: Secondary | ICD-10-CM

## 2024-03-05 MED ORDER — METOPROLOL SUCCINATE ER 50 MG PO TB24: 50.0000 mg | ORAL_TABLET | Freq: Two times a day (BID) | ORAL | 3 refills | Status: AC

## 2024-03-05 MED ORDER — EZETIMIBE 10 MG PO TABS: 10.0000 mg | ORAL_TABLET | Freq: Every day | ORAL | 3 refills | Status: AC

## 2024-03-05 NOTE — Patient Instructions (Signed)
 Medication Instructions:   Your physician recommends that you continue on your current medications as directed. Please refer to the Current Medication list given to you today.    Prescriptions for the following have been sent to your pharmacy ([facility], [place]):  Metoprolol  Succinate (Toprol  XL) 50 mg 2 times daily with meals Ezetimibe  (Zetia ) 10 mg once daily  *If you need a refill on your cardiac medications before your next appointment, please call your pharmacy*  Lab Work:  None ordered at this time   If you have labs (blood work) drawn today and your tests are completely normal, you will receive your results only by:  MyChart Message (if you have MyChart) OR  A paper copy in the mail If you have any lab test that is abnormal or we need to change your treatment, we will call you to review the results.  Testing/Procedures:  GEOFFRY HEWS- Long Term Monitor Instructions  Your physician has requested you wear a ZIO patch monitor for 14 days.  This is a single patch monitor. [employer] supplies one patch monitor per enrollment. Additional stickers are not available. Please do not apply patch if you will be having a Nuclear Stress Test, Echocardiogram, Cardiac CT, MRI, or Chest Xray during the period you would be wearing the monitor. The patch cannot be worn during these tests. You cannot remove and re-apply the ZIO XT patch monitor.  Your ZIO patch monitor will be mailed 3 day USPS to your address on file. It may take 3-5 days to receive your monitor after you have been enrolled. Once you have received your monitor, please review the enclosed instructions. Your monitor has already been registered assigning a specific monitor serial number to you.  Billing and Patient Assistance Program Information  We have supplied [employer] with any of your insurance information on file for billing purposes.  [employer] offers a sliding scale Patient Assistance Program for patients that do not have insurance, or  whose insurance does not completely cover the cost of the ZIO monitor.  You must apply for the Patient Assistance Program to qualify for this discounted rate.  To apply, please call [employer] at [phone], select option 4, select option 2, ask to apply for Patient Assistance Program. Meredeth will ask your household income, and how many people are in your household. They will quote your out-of-pocket cost based on that information. [employer] will also be able to set up a 69-month, interest-free payment plan if needed.  Applying the monitor   Shave hair from upper left chest.  Hold abrader disc by orange tab. Rub abrader in 40 strokes over the upper left chest as indicated in your monitor instructions.  Clean area with 4 enclosed alcohol pads. Let dry.  Apply patch as indicated in monitor instructions. Patch will be placed under collarbone on left side of chest with arrow pointing upward.  Rub patch adhesive wings for 2 minutes. Remove white label marked 1. Remove the white label marked 2. Rub patch adhesive wings for 2 additional minutes.  While looking in a mirror, press and release button in center of patch. A small green light will flash 3-4 times. This will be your only indicator that the monitor has been turned on.  Do not shower for the first 24 hours. You may shower after the first 24 hours.  Press the button if you feel a symptom. You will hear a small click. Record Date, Time and Symptom in the Patient Logbook.  When you are ready to  remove the patch, follow instructions on the last 2 pages of Patient Logbook.  Stick patch monitor into the tabs at the bottom of the return box.  Place Patient Logbook in the blue and white box. Use locking tab on box and tape box closed securely. The blue and white box has prepaid postage on it. Please place it in the mailbox as soon as possible. Your physician should have your test results approximately 7-14 days after the monitor has been mailed back to  [facility].  Call [facility] Customer Care at [phone] if you have questions regarding your ZIO XT patch monitor.  Call them immediately if you see an orange light blinking on your monitor.  If your monitor falls off in less than 4 days, contact our Monitor department at [phone].  If your monitor becomes loose or falls off after 4 days call [employer] at [phone] for suggestions on securing your monitor.   Referrals:  Your cardiologist has referred you to Cardiology Electrophysiology  We have attached their office location and phone number below.  Please allow them 3-5 business days to reach out to you to make an appointment.  If you have not heard from their office within that time, please call them to schedule your appointment.    Follow-Up:  At [facility], you and your health needs are our priority.  As part of our continuing mission to provide you with exceptional heart care, our providers are all part of one team.  This team includes your primary Cardiologist (physician) and Advanced Practice Providers or APPs (Physician Assistants and Nurse Practitioners) who all work together to provide you with the care you need, when you need it.  Your next appointment:   8 week(s)  Provider:    Evalene Lunger, MD or Tylene Lunch, NP    We recommend signing up for the patient portal called MyChart.  Sign up information is provided on this After Visit Summary.  MyChart is used to connect with patients for Virtual Visits (Telemedicine).  Patients are able to view lab/test results, encounter notes, upcoming appointments, etc.  Non-urgent messages can be sent to your provider as well.   To learn more about what you can do with MyChart, go to [email or web address].

## 2024-03-05 NOTE — Progress Notes (Signed)
 " Cardiology Office Note   Date:  03/05/2024  ID:  Valerie, Sparks September 18, 1955, MRN [id number] PCP: Valerie Comer POUR, NP  [facility] Providers Cardiologist:  Valerie Lunger, MD Cardiology APP:  Valerie Frederick, NP     History of Present Illness Valerie Sparks is a 69 y.o. female with a past medical history of paroxysmal atrial fibrillation, mixed lipidemia, elevated blood pressures at diagnosis of hypertension, prediabetes, is here today for follow-up.   Prior ZIO monitor revealed 41% burden of atrial fibrillation with average heart rate 107 bpm. Echocardiogram July 2024 with normal left and right ventricular size and function, mildly elevated right heart pressures, moderate mitral regurgitation, moderate to severe tricuspid valve regurgitation.  She subsequently started on furosemide  20 mg daily with potassium 20 mEq daily.  She was placed on rosuvastatin  and started developing hand cramps.  She continued to have paroxysmal tachycardia and paroxysmal atrial fibrillation.  She was started on metoprolol  tartrate and was having regular meals overnight.  She continued to use Apple Watch to monitor for heart arrhythmias.  Previously seen in clinic 09/30/2022 by Dr. Gollan.  She had had several episodes of breakthrough atrial fibrillation but they have been improving.  She was changed from metoprolol  to tartrate to succinate 50 mg twice daily continued on Xarelto  20 mg daily.  Rosuvastatin  was decreased to 10 mg every other day and she was continued on ezetimibe  10 mg daily.  She was last seen in clinic 11/30/2023 doing fairly well up until Saturday and Sunday when she started having dizziness and believes it she may have passed out.  Her Apple watch had alerted her she had elevated heart rates.  She also had associated symptoms of shortness of breath and used extra metoprolol .  She was sent for updated labs.  She was scheduled for an updated echocardiogram as well.   She returns to  clinic today stating that she has continued recurrence of A-fib episodes even with taking additional metoprolol .  She continues to suffer from palpitations that make her fatigued, short of breath, and weak.  States that she has been compliant with her current medication regimen without any adverse side effects.  Further discussion reveals that she has not been taking her furosemide  and has noted weight gain.  States that she has not missed any doses of her rivaroxaban .  Denies any bleeding with no blood noted in her urine or stool.  She denies any hospitalizations or visits to the emergency department.  ROS: 10 point review of system has been reviewed and considered negative the exception was been listed in HPI  Studies Reviewed EKG Interpretation Date/Time:  Tuesday March 05 2024 09:16:33 EST Ventricular Rate:  50 PR Interval:  146 QRS Duration:  78 QT Interval:  456 QTC Calculation: 415 R Axis:   17  Text Interpretation: Sinus bradycardia When compared with ECG of 30-Nov-2023 08:25, No significant change was found Confirmed by Valerie Sparks ([id number]) on 03/05/2024 9:21:13 AM    2d echo 01/15/2024 1. Left ventricular ejection fraction, by estimation, is 55 to 60%. The  left ventricle has normal function. The left ventricle has no regional  wall motion abnormalities. Left ventricular diastolic parameters are  consistent with Grade II diastolic  dysfunction (pseudonormalization).   2. Right ventricular systolic function is normal. The right ventricular  size is normal.   3. The mitral valve is normal in structure. Mild mitral valve  regurgitation.   4. The aortic valve is calcified. Aortic valve regurgitation  is not  visualized. Aortic valve sclerosis/calcification is present, without any  evidence of aortic stenosis.   5. The inferior vena cava is normal in size with greater than 50%  respiratory variability, suggesting right atrial pressure of 3 mmHg.   2D echo 08/24/2022  1. Left  ventricular ejection fraction, by estimation, is 60 to 65%. The  left ventricle has normal function. The left ventricle has no regional  wall motion abnormalities. Left ventricular diastolic parameters were  normal.   2. Right ventricular systolic function is normal. The right ventricular  size is normal. There is mildly elevated pulmonary artery systolic  pressure. The estimated right ventricular systolic pressure is 41.2 mmHg.   3. The mitral valve is normal in structure. Moderate mitral valve  regurgitation. No evidence of mitral stenosis.   4. Tricuspid valve regurgitation is moderate to severe.   5. The aortic valve is normal in structure. Aortic valve regurgitation is  not visualized. Aortic valve sclerosis is present, with no evidence of  aortic valve stenosis.   6. The inferior vena cava is normal in size with greater than 50%  respiratory variability, suggesting right atrial pressure of 3 mmHg.    Event Monitor (Zio) 06/28/2022 HR 48 - 234 bpm, average 84 bpm. 1 nonsustained VT lasting 7 beats. 41% AF burden, average HR 107 bpm Symptom triggers correspond to AF. Rare ventricular ectopy.   Urgent cardiology referral placed for new diagnosis AF.   Risk Assessment/Calculations  CHA2DS2-VASc Score = 3   This indicates a 3.2% annual risk of stroke. The patient's score is based upon: CHF History: 0 HTN History: 1 Diabetes History: 0 Stroke History: 0 Vascular Disease History: 0 Age Score: 1 Gender Score: 1            Physical Exam VS:  BP 128/80 (BP Location: Left Arm, Patient Position: Sitting, Cuff Size: Large)   Pulse (!) 50   Ht 5' 2 (1.575 m)   Wt 187 lb 2 oz (84.9 kg)   SpO2 98%   BMI 34.23 kg/m        Wt Readings from Last 3 Encounters:  03/05/24 187 lb 2 oz (84.9 kg)  11/30/23 182 lb (82.6 kg)  09/19/23 183 lb (83 kg)    GEN: Well nourished, well developed in no acute distress NECK: No JVD; No carotid bruits CARDIAC: RRR, no murmurs, rubs,  gallops RESPIRATORY:  Clear to auscultation without rales, wheezing or rhonchi  ABDOMEN: Soft, non-tender, non-distended EXTREMITIES:  No edema; No deformity   ASSESSMENT AND PLAN Paroxysmal atrial fibrillation she is maintaining sinus of bradycardia with rate of 50 with no acute ischemic changes noted on EKG today.  She states she has had several episodes last week which were chronic should doses of metoprolol  to watch alerted her that her atrial fibrillation with rates that were elevated.  She is symptomatic to these episodes with worsening fatigue and shortness of breath.  She states that she has not missed any doses of her rivaroxaban  and has not had any issues with bleeding with her blood noted in her urine or stool.  She is continue rivaroxaban  20 mg daily for CHA2DS2-VASc score of at least 3 for stroke prophylaxis.  Labs previously been updated and were stable.  With continued symptoms and frequent breakthrough episodes of atrial fibrillation she has been placed on a ZIO XT monitor for 2 weeks and referred to EP for further antiarrhythmic and to discuss potential ablation procedure.  Mixed hyperlipidemia with last LDL  78.  She is continued on ezetimibe  10 mg daily rosuvastatin  10 mg every other day.  Ongoing management per PCP.  Primary hypertension with a blood pressure today 128/80.  Blood pressures remained stable.  She is continued on Toprol -XL 50 mg twice daily and encouraged to take her furosemide  as prescribed.  She is also been encouraged to continue to monitor her blood pressures 1 to 2 hours postmedication administration as well.  Prediabetes with a last hemoglobin A1c of 6.3.  Ongoing management per PCP.  Shortness of breath peripheral edema with recent echocardiogram completed revealing an LVEF of 55 to 60%, no RWMA, G2 DD, mild mitral valve regurgitation and mild tricuspid regurgitation.       Dispo: Patient to return to clinic to see MD/APP in 8 weeks or sooner if needed for  further evaluation.  Signed, Elga Santy, NP   "

## 2024-04-08 ENCOUNTER — Ambulatory Visit: Admitting: Cardiology

## 2024-04-30 ENCOUNTER — Ambulatory Visit: Admitting: Cardiovascular Disease

## 2024-08-14 ENCOUNTER — Ambulatory Visit
